# Patient Record
Sex: Male | Born: 1935 | Race: White | Hispanic: No | Marital: Married | Smoking: Never smoker
Health system: Southern US, Community
[De-identification: ages and names within clinical notes are randomized; demographics above are authoritative.]

## PROBLEM LIST (undated history)

## (undated) DIAGNOSIS — E039 Hypothyroidism, unspecified: Secondary | ICD-10-CM

## (undated) DIAGNOSIS — I10 Essential (primary) hypertension: Secondary | ICD-10-CM

## (undated) DIAGNOSIS — E079 Disorder of thyroid, unspecified: Secondary | ICD-10-CM

## (undated) DIAGNOSIS — M1990 Unspecified osteoarthritis, unspecified site: Secondary | ICD-10-CM

## (undated) DIAGNOSIS — E119 Type 2 diabetes mellitus without complications: Secondary | ICD-10-CM

## (undated) HISTORY — PX: EYE SURGERY: SHX253

---

## 2001-03-17 ENCOUNTER — Encounter: Payer: Self-pay | Admitting: Family Medicine

## 2001-03-17 ENCOUNTER — Ambulatory Visit (HOSPITAL_COMMUNITY): Admission: RE | Admit: 2001-03-17 | Discharge: 2001-03-17 | Payer: Self-pay | Admitting: Family Medicine

## 2001-03-20 ENCOUNTER — Encounter: Payer: Self-pay | Admitting: Family Medicine

## 2001-03-20 ENCOUNTER — Ambulatory Visit (HOSPITAL_COMMUNITY): Admission: RE | Admit: 2001-03-20 | Discharge: 2001-03-20 | Payer: Self-pay | Admitting: Family Medicine

## 2001-05-15 ENCOUNTER — Ambulatory Visit (HOSPITAL_COMMUNITY): Admission: RE | Admit: 2001-05-15 | Discharge: 2001-05-15 | Payer: Self-pay | Admitting: Internal Medicine

## 2001-09-15 ENCOUNTER — Ambulatory Visit (HOSPITAL_COMMUNITY): Admission: RE | Admit: 2001-09-15 | Discharge: 2001-09-15 | Payer: Self-pay | Admitting: Internal Medicine

## 2003-03-09 ENCOUNTER — Other Ambulatory Visit: Admission: RE | Admit: 2003-03-09 | Discharge: 2003-03-09 | Payer: Self-pay | Admitting: Dermatology

## 2003-07-28 ENCOUNTER — Other Ambulatory Visit: Admission: RE | Admit: 2003-07-28 | Discharge: 2003-07-28 | Payer: Self-pay | Admitting: Dermatology

## 2003-09-06 ENCOUNTER — Encounter: Payer: Self-pay | Admitting: Family Medicine

## 2003-09-06 ENCOUNTER — Ambulatory Visit (HOSPITAL_COMMUNITY): Admission: RE | Admit: 2003-09-06 | Discharge: 2003-09-06 | Payer: Self-pay | Admitting: Family Medicine

## 2004-03-12 ENCOUNTER — Ambulatory Visit (HOSPITAL_COMMUNITY): Admission: RE | Admit: 2004-03-12 | Discharge: 2004-03-12 | Payer: Self-pay | Admitting: Ophthalmology

## 2004-10-01 ENCOUNTER — Ambulatory Visit (HOSPITAL_COMMUNITY): Admission: RE | Admit: 2004-10-01 | Discharge: 2004-10-01 | Payer: Self-pay | Admitting: Family Medicine

## 2004-10-05 ENCOUNTER — Inpatient Hospital Stay (HOSPITAL_COMMUNITY): Admission: AD | Admit: 2004-10-05 | Discharge: 2004-10-08 | Payer: Self-pay | Admitting: Internal Medicine

## 2005-02-01 ENCOUNTER — Ambulatory Visit: Payer: Self-pay | Admitting: Internal Medicine

## 2005-03-07 ENCOUNTER — Ambulatory Visit: Payer: Self-pay | Admitting: Internal Medicine

## 2005-03-07 ENCOUNTER — Ambulatory Visit (HOSPITAL_COMMUNITY): Admission: RE | Admit: 2005-03-07 | Discharge: 2005-03-07 | Payer: Self-pay | Admitting: Internal Medicine

## 2005-08-15 ENCOUNTER — Ambulatory Visit (HOSPITAL_COMMUNITY): Admission: RE | Admit: 2005-08-15 | Discharge: 2005-08-15 | Payer: Self-pay | Admitting: Internal Medicine

## 2006-05-13 ENCOUNTER — Inpatient Hospital Stay (HOSPITAL_COMMUNITY): Admission: EM | Admit: 2006-05-13 | Discharge: 2006-05-16 | Payer: Self-pay | Admitting: *Deleted

## 2006-05-15 ENCOUNTER — Ambulatory Visit: Payer: Self-pay | Admitting: Gastroenterology

## 2006-06-13 ENCOUNTER — Ambulatory Visit (HOSPITAL_COMMUNITY): Admission: RE | Admit: 2006-06-13 | Discharge: 2006-06-14 | Payer: Self-pay | Admitting: Orthopedic Surgery

## 2006-12-16 ENCOUNTER — Ambulatory Visit (HOSPITAL_COMMUNITY): Admission: RE | Admit: 2006-12-16 | Discharge: 2006-12-16 | Payer: Self-pay | Admitting: Orthopedic Surgery

## 2008-03-29 ENCOUNTER — Ambulatory Visit (HOSPITAL_COMMUNITY): Admission: RE | Admit: 2008-03-29 | Discharge: 2008-03-29 | Payer: Self-pay | Admitting: Internal Medicine

## 2008-04-01 ENCOUNTER — Ambulatory Visit (HOSPITAL_COMMUNITY): Admission: RE | Admit: 2008-04-01 | Discharge: 2008-04-01 | Payer: Self-pay | Admitting: Internal Medicine

## 2008-07-28 ENCOUNTER — Ambulatory Visit (HOSPITAL_COMMUNITY): Admission: RE | Admit: 2008-07-28 | Discharge: 2008-07-28 | Payer: Self-pay | Admitting: Internal Medicine

## 2008-07-28 ENCOUNTER — Encounter (INDEPENDENT_AMBULATORY_CARE_PROVIDER_SITE_OTHER): Payer: Self-pay | Admitting: Interventional Radiology

## 2010-10-25 ENCOUNTER — Emergency Department (HOSPITAL_COMMUNITY): Admission: EM | Admit: 2010-10-25 | Discharge: 2010-02-05 | Payer: Self-pay | Admitting: Emergency Medicine

## 2011-02-10 LAB — DIFFERENTIAL
Basophils Absolute: 0 10*3/uL (ref 0.0–0.1)
Eosinophils Relative: 0 % (ref 0–5)
Lymphocytes Relative: 20 % (ref 12–46)
Lymphs Abs: 1.8 10*3/uL (ref 0.7–4.0)
Monocytes Absolute: 0.5 10*3/uL (ref 0.1–1.0)
Neutro Abs: 6.9 10*3/uL (ref 1.7–7.7)

## 2011-02-10 LAB — CBC
HCT: 43 % (ref 39.0–52.0)
Hemoglobin: 15.3 g/dL (ref 13.0–17.0)
RBC: 4.5 MIL/uL (ref 4.22–5.81)
WBC: 9.2 10*3/uL (ref 4.0–10.5)

## 2011-04-05 NOTE — Op Note (Signed)
NAME:  Jake Becker NO.:  [id number]   MEDICAL RECORD NO.:  [id number]          PATIENT TYPE:  OIB   LOCATION:  5032                         FACILITY:  [facility]   PHYSICIAN:  Burnard Bunting, M.D.    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   PROCEDURE:  Right shoulder manipulation under anesthesia with diagnostic  intraoperative arthroscopy and biceps release, debridement of labrum and  rotator cuff followed by open biceps tenodesis and rotator cuff repair of  the infraspinatus, supraspinatus, and the superior portion of the  subscapularis.   INDICATIONS:  Jake Becker is a 75 year old patient with right shoulder  pain who is 8 weeks out from right shoulder dislocation.  He sustained  rotator cuff tearing during that dislocation and has subsequently developed  a frozen shoulder.  He presents now for operative management of the rotator  cuff tear and frozen shoulder, and of the subluxated biceps tendon.   OPERATIVE FINDINGS:  1.  Examination under anesthesia.  Range of motion and external rotation 15      degrees, abduction 0, isolated glenohumeral abduction 40, isolated      glenohumeral forward flexion 60.  Post manipulation, the patient had 170      of forward flexion, about 45 degrees of external rotation at 15 degrees      of abduction.  Isolated glenohumeral abduction was 110.  The patient had      2+ anterior instability following manipulation with subluxation, but not      frank dislocation.  2.  Diagnostic arthroscopy.      1.  Subluxation of the biceps tendon with type II SLAP tear and labral           degeneration.      2.  Rotator cuff tear involving supraspinatus and infraspinatus with          both soft tissue and bony components of the rotator cuff tear.      3.  Impingement bursitis.      4.  Significant synovitis within the shoulder joint itself.   PROCEDURE IN DETAIL:  The patient was brought to the operating room where  general endotracheal anesthesia was induced and preoperative antibiotics  were administered.  The right shoulder, arm, and hand were prepped with  DuraPrep solution and draped in a sterile manner.  Within the [place]  positioner, the patient's head was in neutral position and the left arm was  well padded.  Following prepping, the patient was draped in a sterile  manner.  The right shoulder topographic anatomy was then marked, including  the posterolateral and anterior margin of the acromion as well as the [facility]  joint and the coracoid process.  Solution of saline and epinephrine was  injected into the subacromial space.  Solution of saline was injected into  the shoulder joint.  A posterior portal was created 2 cm medial and inferior  to the posterolateral margin of the acromion.  The scope was then inserted.  The patient was noted to have a type II to type IV SLAP tear with  significant synovitis within the rotator interval and all aspects of the  rotator cuff and joint itself.  This synovitis was extensively debrided, and  the biceps tendon was released.  Torn labrum superiorly was debrided.  At  this time, following debridement and biceps tendon release facilitated with  placement of an anterior portal, the instruments were removed.  The shoulder  joint was thoroughly irrigated prior to the removal, and the portals were  closed using 3-0 nylon suture.  The shoulder was then reprepped and redraped  with an [place].  An incision was made at the mid portion of the acromion to  repair both the infraspinatus and supraspinatus, as well as to achieve  biceps tenodesis.   The skin incision was made.  Skin and subcutaneous tissue  were sharply divided.  Deltoid was split a measured distance of 4 cm from  the lateral margin of the acromion.  A #1-Vicryl suture was placed at the  bottom of the split to prevent further splitting of the deltoid.  Bursectomy  and subacromial decompression was performed.  The CA ligament was not  released.  The patient was noted to have a rotator cuff tear involving the  supraspinatus and infraspinatus which had both bony and soft tissue  components.  The superior aspect of the transverse humeral ligament was then  incised, and the biceps tendon was identified as it had subluxated medial to  the lesser tuberosity.  The superior portion of the subscap was also torn  and required repair.  At this time, #2-Fiberwire suture was placed through  the biceps tendon, which had already begun the process of autotenodesing.  The tendon was tenodesed using a 7-mm drill into the inferior aspect of the  bicipital groove.  Following biceps tenodesis, the 2 free suture ends were  used to secure the subscapularis back to the lesser tuberosity.  This was  reinforced with a crossing suture across the bicipital groove for optimal  fixation.  In a similar manner, the bony portion of the infraspinatus tear  was fixed with a repair using a corkscrew suture anchor placed through the  bone itself.  This gave a secure repair on the infraspinatus.  The  supraspinatus was then repaired using 2 corkscrew suture anchors and 2  PushLock suture anchors to reproduce the footprint.  The patient had  excellent improved stability, but maintained his range of motion following  the rotator cuff repair.  The incision was thoroughly irrigated.  Deltoid  split was closed using #1-Vicryl suture followed by interrupted inverted 2-0  Vicryl suture and running 3-0 pull-out Prolene.  A bulky dressing and  shoulder immobilizer were applied.  The patient tolerated the  procedure well, without immediate complications.           ______________________________  G. Dorene Grebe, M.D.     GSD/MEDQ  D:  06/13/2006  T:  06/14/2006  Job:  [id number]

## 2011-04-05 NOTE — Op Note (Signed)
NAME:  Jake Becker, Jake Becker               ACCOUNT NO.:  [id number]   MEDICAL RECORD NO.:  [id number]          PATIENT TYPE:  AMB   LOCATION:  SDS                          FACILITY:  [facility]   PHYSICIAN:  Burnard Bunting, M.D.    DATE OF BIRTH:  11-29-1935   DATE OF PROCEDURE:  12/16/2006  DATE OF DISCHARGE:                               OPERATIVE REPORT   PREOPERATIVE DIAGNOSIS:  Right frozen shoulder.   POSTOPERATIVE DIAGNOSIS:  Right frozen shoulder.   PROCEDURE:  Right shoulder manipulation under anesthesia with extensive  arthroscopic debridement of rotator interval.   SURGEON:  Burnard Bunting, M.D.   ASSISTANT:  None.   ANESTHESIA:  General endotracheal.   ESTIMATED BLOOD LOSS:  Minimal.   INDICATIONS:  Jake Becker is a 75 year old patient who fell and  sustained dislocation and rotator cuff tear to his shoulder.  He  subsequently underwent rotator cuff repair and immobilization.  He is  unable to regain significant overhead range of motion, despite attempts  at physical therapy, and presents now for manipulation and debridement.   OPERATIVE FINDINGS:  Examination under anesthesia:  Range of motion,  external rotation at 15 degrees of abduction, about 5 degrees, isolated  glenohumeral abduction at 70, isolated glenohumeral forward flexion is  also about 75.   Arthroscopic findings:  1. Thickened rotator interval without acute inflammatory changes, but      with chronic thickening of the capsule and labrum.  2. Intact rotator cuff repair with the exception of some thinning of      the rotator cuff tissue at the infraspinatus attachment.  3. No bursitis within the subacromial space with adequate      decompression.   PROCEDURE IN DETAIL:  The patient was brought to the operating room  where general endotracheal anesthesia was induced.  The right shoulder  was manipulated into forward flexion, glenohumeral abduction, and  external rotation.  All in all about 20 degrees of  motion was achieved  in each plane.  The patient had fairly rigid glenohumeral adhesions.  Despite this, some gains in range of motion were achieved in forward  flexion, abduction, and external rotation.  At this time the patient was  placed in the head in neutral position in the beach chair position.  He  had the subacromial spaces injected with saline with epinephrine.  The  glenohumeral joint was injected with saline.  The scope was placed into  the glenohumeral joint on the first attempt.  Diagnostic arthroscopy was  performed.  Anterior portal was created.  Extensive debridement of the  rotator interval was performed.  Rotator cuff repair appeared intact,  except for thinning of the rotator cuff tissue at the infraspinatus  attachment.  The intraarticular subscapula was then tacked.  At this  time, extensive debridement of the rotator interval and labrum was  performed.  The nature of the tissue is more in the noninflammatory  phase of frozen shoulder.  At this time the scope was placed into the  subacromial space and no bursitis or subdeltoid adhesions were noted.  No decompression was performed.  At this time, the instruments were  removed from their portals, which were then closed using 3-0 nylon  suture.  The shoulder was then dressed.  Solution of Marcaine, morphine,  and clonidine was put into the glenohumeral joint.  The patient  tolerated the procedure well without immediate complication.      Burnard Bunting, M.D.  Electronically Signed     GSD/MEDQ  D:  12/16/2006  T:  12/16/2006  Job:  [id number]

## 2011-04-05 NOTE — H&P (Signed)
NAME:  Jake Becker, FILL NO.:  [id number]   MEDICAL RECORD NO.:  [id number]           PATIENT TYPE:  INP   LOCATION:  A332                          FACILITY:  [facility]   PHYSICIAN:  Kirk Ruths, M.D.DATE OF BIRTH:  1936/01/24   DATE OF ADMISSION:  DATE OF DISCHARGE:  LH                                HISTORY & PHYSICAL   CHIEF COMPLAINT:  Fever and chills for 2 days.   HISTORY OF PRESENT ILLNESS:  A 75 year old male without significant medical  problems who presents to the office with a 2-day history of fever, chills,  and extreme rigors. The patient has essentially a negative review of  systems, no cough, chest pain, GI symptoms, dysuria, back pain, tick bites,  shortness of breath. In the office, he was having rigors, his temperature  was rising to 101. Urinalysis in the office was significant only for 3+  urobilinogen and was very amber. Of note, the patient approximately a month  ago fell out of a tree and broke several ribs, dislocated is right shoulder  but these problems seem to have subsided and are not symptomatic at this  time.   PAST MEDICAL HISTORY:  He has an anaphylaxis to bee stings otherwise not  allergic to anything. He has a history of hypertension, detached retina, and  diverticulitis. He takes Univasc 7.5 mg daily, Ranitidine 150 b.i.d.   REVIEW OF SYSTEMS:  Included in HPI.   PHYSICAL EXAMINATION:  GENERAL:  A well-developed male who is in extreme  rigors.  VITAL SIGNS:  Temperature is 101, pulse is 88 and regular, respirations 18.  Blood pressure 146/76.  HEENT:  Tympanic membranes are normal, pupils equal to light and  accommodation, oropharynx benign.  NECK:  Supple without JVD, bruit or thyromegaly.  LUNGS:  Clear in all areas.  HEART:  Regular rate and rhythm without murmur, gallop or rub.  ABDOMEN:  Soft, nontender.  EXTREMITIES:  Without cyanosis, clubbing or edema.  NEUROLOGIC:  Grossly intact.   ASSESSMENT:  1.  Fever of  unknown origin.  2.  History of hypertension.      Kirk Ruths, M.D.  Electronically Signed     WMM/MEDQ  D:  05/13/2006  T:  05/13/2006  Job:  [id number]

## 2011-04-05 NOTE — H&P (Signed)
NAME:  Jake Becker, Jake Becker NO.:  [id number]   MEDICAL RECORD NO.:  [id number]           PATIENT TYPE:   LOCATION:                                 FACILITY:   PHYSICIAN:  Madelin Rear. Sherwood Gambler, MD  DATE OF BIRTH:  1936-09-27   DATE OF ADMISSION:  10/05/2004  DATE OF DISCHARGE:  LH                                HISTORY & PHYSICAL   CHIEF COMPLAINT:  Shortness of breath.   HISTORY OF PRESENT ILLNESS:  The patient has been treated with Claforan and  Levaquin for an existing documented left lower lobe pneumonic infiltrate,  documented by Dr. Regino Schultze, on October 01, 2004.  In spite of being  compliant with Levaquin every day, the patient has had progressively  increasing symptoms mostly shortness of breath and possibly some brief  confusional episodes outlined by his wife.  He has also had an exaggerated  startle response when nodding off to sleep.  Denies any headache or stiff  neck or other meningeal signs of symptoms.  No other neurologic symptoms.  He denies any vomiting or diarrhea.  No skin rash.  There were no tick  bites.  A chest x-ray, obtained at the aforementioned office visit today,  did confirm a left lower lobe infiltrate.   PAST MEDICAL HISTORY:  1.  Hypertension, maintained on an ACE inhibitor for that.  2.  Previous diverticulitis and diverticulosis known.  3.  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  4.  He also has had a previous detached retina.  5.  Status post herniorrhaphy.   SOCIAL HISTORY:  Nonsmoker, nondrinker.  No other drug use.  He is retired  at present.   FAMILY HISTORY:  Positive for abdominal aortic aneurysm in his father.  Traumatic death of paternal grandfather and coronary disease in his paternal  grandmother.  Otherwise family history is noncontributory.   REVIEW OF SYSTEMS:  As under HPI, all else negative.   PHYSICAL EXAMINATION:  SKIN:  Unremarkable.  HEAD/NECK:  No JVD or adenopathy.  Neck was supple.  CHEST:  Diminished breath sounds, dullness, and rales in the left lower  lobe.  Rales present also in the base of the right lung.  CARDIAC:  Regular rhythm.  No gallop or rub.  ABDOMEN:  Soft.  No organomegaly or masses.  EXTREMITIES:  Without clubbing, cyanosis, or edema.  NEUROLOGIC:  Gross examination is nonfocal.   IMPRESSION:  1.  The patient has progression of pneumonia with increasing symptoms,      diminished breath sounds on the left side.  He is being admitted for      parenteral antibiotic therapy.  It also appears that he has failed      Levaquin; therefore, he will be admitted for a more broad spectrum      antibiotic coverage.  A chest x-ray will be repeated rule out of an      obstructive lesion possibly contributing to failure of resolution on      adequate outpatient therapy.  Bronchodilators and chest PT as indicated.  2.  Hypertension.  At present he  is pretty well controlled.  Monitor and      intervene as necessary with expectant observation.     Lawr   LJF/MEDQ  D:  10/05/2004  T:  10/05/2004  Job:  [id number]

## 2011-04-05 NOTE — Consult Note (Signed)
NAME:  ELAINE, MIDDLETON               ACCOUNT NO.:  [id number]   MEDICAL RECORD NO.:  [id number]          PATIENT TYPE:  INP   LOCATION:  A332                          FACILITY:  [facility]   PHYSICIAN:  Lionel December, M.D.    DATE OF BIRTH:  [birth year]   DATE OF CONSULTATION:  05/15/2006  DATE OF DISCHARGE:                                   CONSULTATION   REASON FOR CONSULTATION:  Elevated LFTs and fever.   REQUESTING PHYSICIAN:  Dr. Karleen Hampshire   HISTORY OF PRESENT ILLNESS:  Mr. Landgrebe is a 75 year old gentleman who  developed fever and rigors Sunday evening after returning from [place] where  he was visiting his daughter.  He saw Dr. Regino Schultze Tuesday morning after  persistent fevers and was admitted to the hospital.  Work-up thus far has  failed to reveal a cause of his fever.  He denies any ill contacts.  No  known tick bites.  He has had no associated nausea or vomiting, abdominal  pain, constipation, diarrhea, melena, or rectal bleeding.  Had an abdominal  ultrasound which revealed upper limits of normal gallbladder wall thickening  measuring 2.7 mm, echogenic liver, prominent spleen measuring 16.7 cm.  Chest x-ray revealed bibasilar atelectasis or scarring.  His total bilirubin  was 1.4 initially, is down to 1.2, mostly of indirect bilirubin.  His  alkaline phosphatase is normal at 110.  His AST was 56, up to 68, ALT 45, up  to 54, and albumin 3.4.  Lyme and [facility] Spotted Fever titers are  negative.  His blood culture and urine culture are pending, but remain  negative.  Sedimentation rate was 2.  White count down to 2200 today with an  ANC of 1.1.  Platelet count down from 117,000 to 90,000.  Hemoglobin is  normal at 14.8.  Overall, he feels fine except for the fever and rigors when  they occur.   HOME MEDICATIONS:  1.  Univasc 15 mg daily.  2.  Metamucil two tablets daily.  3.  Calcium 500 mg daily.   ALLERGIES:  No known drug allergies.   PAST MEDICAL HISTORY:  1.  Hypertension.  2.  Right inguinal hernia repair in the remote past.  3.  History of pan colonic diverticulosis mostly in the sigmoid colon, small      external hemorrhoids on colonoscopy in April 2006 by Dr. Karilyn Cota.  4.  History of left lower lobe pneumonia in November 2005.  5.  History of branch retinal vein occlusion of the right eye and vitreous      hemorrhage of the right eye status post surgery.  He had an      echocardiogram during this hospitalization which revealed mild aortic      stenosis.   FAMILY HISTORY:  Negative for chronic GI illnesses, colorectal cancer.   SOCIAL HISTORY:  He is married with one daughter.  He is retired.  Never  smoked cigarettes.  Occasionally consumes wine.  Denies any blood  transfusions or tattoos.   REVIEW OF SYSTEMS:  See HPI for GI.  CONSTITUTIONAL:  No weight loss.  GU:  Complains of urinary frequency and some hesitancy.   PHYSICAL EXAMINATION:  VITAL SIGNS:  Tmax 102.6, Tcurrent 98.7, pulse 83,  respirations 16, blood pressure 133/75.  GENERAL:  Pleasant, well-nourished, well-developed   Dictation ended at this point.      Tana Coast, P.A.      Lionel December, M.D.  Electronically Signed    LL/MEDQ  D:  05/15/2006  T:  05/15/2006  Job:  [id number]

## 2011-04-05 NOTE — Discharge Summary (Signed)
NAME:  Jake Becker, Jake Becker               ACCOUNT NO.:  [id number]   MEDICAL RECORD NO.:  [id number]          PATIENT TYPE:  INP   LOCATION:  A332                          FACILITY:  [facility]   PHYSICIAN:  Madelin Rear. Sherwood Gambler, MD  DATE OF BIRTH:  1936/07/12   DATE OF ADMISSION:  10/05/2004  DATE OF DISCHARGE:  11/21/2005LH                                 DISCHARGE SUMMARY   DISCHARGE DIAGNOSES:  1.  Left lower lobe pneumonia, probable atypical.  2.  Chronic sinusitis.  3.  Left upper extremity paresthesias, question etiology.   DISCHARGE MEDICATIONS:  1.  Vibramycin 100 mg p.o. b.i.d. x14 days.  2.  Levaquin 500 mg p.o. q.d. x14 days.   HOSPITAL COURSE:  The patient was admitted with failure to improve pneumonic  infection on oral antibiotics for about 7 days.  He also developed new-onset  of headaches and some left upper extremity paresthesias that were not  predictable and not associated with any motor symptomatology or cranial  nerve symptomatology.  He was admitted for high-dose parenteral antibiotics.  Chest x-ray was repeated and showed no change of a stable left lower lobe  infiltrate was retrocardiac and barely perceptible on the film.  He was  incidentally noted on rounds to have severe obstructive sleep apnea  clinically.  This was an incidental finding.   FOLLOW UP:  He will follow up with Dr. Gerilyn Pilgrim for evaluation of the upper  extremity paresthesias as well as sleep study for rule out sleep apnea.  He  will follow up in the office in 1 week.  Followup chest x-ray in 1 month.  The patient informed regarding the need for these followups.     Lawr   LJF/MEDQ  D:  10/08/2004  T:  10/08/2004  Job:  [id number]

## 2011-04-05 NOTE — Discharge Summary (Signed)
NAME:  [name], Jake NO.:  [id number]   MEDICAL RECORD NO.:  [id number]          PATIENT TYPE:  INP   LOCATION:  A332                          FACILITY:  [facility]   PHYSICIAN:  Kirk Ruths, M.D.DATE OF BIRTH:  1936-10-28   DATE OF ADMISSION:  05/13/2006  DATE OF DISCHARGE:  06/29/2007LH                                 DISCHARGE SUMMARY   DISCHARGE DIAGNOSES:  1.  Febrile illness.  2.  Leukopenia.  3.  Increased liver function tests.  4.  History of hypertension.  5.  Antibodies positive to hepatitis A.   HOSPITAL COURSE:  This is a 75 year old male who presented to my office with  a 2-day history of fever, chills, and extreme rigors.  In the office he was  found to have a temperature of 101.  Urinalysis had 3+ urobilinogen.  The  patient was admitted to the hospital.  He was placed empirically on  doxicycline and Rocephin.  The patient underwent extensive workup.  He was  found initially to have a white count of 3500, hemoglobin of 15.9.  His  white count drifted down to 2200 and on day of admission was 3800.  The  patient's sed rate was 2, which was perplexing.  Electrolytes were normal.  His liver function tests were elevated.  AST was 56, ALT 45, total bili  initially was 1.4 with indirect being 1.1.  At the time of discharge, AST  was 72, ALT 62, and total bili was down to 1.0.  The patient's heavy metal  screens were negative for arsenic and mercury.  Lead was slightly elevated  at 8.2, with 8.0 being the cutoff.  Urinalysis repeat showed only  urobilinogen, no white cells.  Blood cultures x2 were negative.  Urine  culture was negative.  [facility] spotted fever and Lyme's test were  negative.  Finally, the patient's hepatitis screen came back and he was  positive for hepatitis A antibodies, negative for B and C.  By this time the  patient's fever had defervesced.  He was discharged home on doxycycline and  his previous medications, to be followed  up in my office in 1 week.      Kirk Ruths, M.D.  Electronically Signed     WMM/MEDQ  D:  06/10/2006  T:  06/10/2006  Job:  [id number]

## 2011-04-05 NOTE — Op Note (Signed)
[facility]  Patient:    Jake Becker, Jake Becker Visit Number: [id number] MRN: [id number]          Service Type: END Location: DAY Attending Physician:  Malissa Hippo Dictated by:   Lionel December, M.D. Proc. Date: 09/15/01 Admit Date:  09/15/2001   CC:         Karleen Hampshire, M.D.   Operative Report  PROCEDURE:  Total colonoscopy.  ENDOSCOPIST:  Lionel December, M.D.  INDICATION:  Jake Becker is a 75 year old Caucasian male who is undergoing colonoscopy primarily for diagnostic reasons.  He had rectal bleeding in the past which he does not remember now.  He denies recent change in his bowel habits.  Family history is negative for colorectal carcinoma.  Procedure and risks were reviewed with the patient and informed consent was obtained.  PREOPERATIVE MEDICATIONS:  Demerol 50 mg IV, Versed 4 mg IV in divided dose.  INSTRUMENT:  Olympus video system.  FINDINGS:  Procedure was performed in endoscopy suite.  Patients vital signs and O2 saturations were monitored during the procedure and remained stable. Patient was placed in the left lateral decubitus position and rectal examination performed.  This was within normal limits.  Scope was placed in the rectum and advanced under direct vision to the sigmoid colon and beyond. He had a few scattered pieces of formed stool in the sigmoid colon with a moderate number of diverticula.  Preparation otherwise was satisfactory. Scope was passed into the cecum, which was identified by the ileocecal valve and appendiceal orifice.  Pictures were taken for the record.  As the scope was withdrawn, mucosa was once again carefully examined and there were no polyps and/or tumor masses.  A few diverticula were also noted in the descending colon.  Rectal mucosa was normal.  Scope was retroflexed and examined the anorectal junction, which was unremarkable.  Endoscope was straightened and withdrawn.  Patient tolerated the  procedure well.  FINAL DIAGNOSIS:  Left colonic diverticulosis, otherwise normal examination to the cecum.  RECOMMENDATIONS: 1. High-fiber diet. 2. Metamucil one tablespoonful daily.   3. He should continue yearly Hemoccults and consider having next exam in    10 years from now. Dictated by:   Lionel December, M.D. Attending Physician:  Malissa Hippo DD:  09/15/01 TD:  09/16/01 Job: [id number]

## 2011-04-05 NOTE — Op Note (Signed)
NAME:  Jake Becker, Jake Becker NO.:  [id number]   MEDICAL RECORD NO.:  [id number]                   PATIENT TYPE:  OIB   LOCATION:  2899                                 FACILITY:  [facility]   PHYSICIAN:  Alford Highland. Rankin, M.D.                DATE OF BIRTH:  12-Feb-1936   DATE OF PROCEDURE:  03/12/2004  DATE OF DISCHARGE:  03/12/2004                                 OPERATIVE REPORT   PREOPERATIVE DIAGNOSES:  1. Dense vitreous hemorrhage, right eye, nonclearing.  2. History of branch retinal vein occlusion, right eye.   POSTOPERATIVE DIAGNOSIS:  1. Dense vitreous hemorrhage, right eye, nonclearing.  2. History of branch retinal vein occlusion, right eye.   PROCEDURE:  1. Posterior vitrectomy and endolaser pan photocoagulation.  2. Endodiathermy of retinopathy, right eye.   SURGEON:  Alford Highland. Rankin, M.D.   ANESTHESIA:  Local retrobulbar with monitored anesthesia control.   INDICATIONS:  The patient is a 75 year old man with has had profound vision  loss in the right eye on the basis on dense, vitreous, nonclearing  hemorrhage.  This is an attempt to clear the vitreous medial opacities so as  to allow for best visual acuity but also to induce quiescence of  retinopathy.  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  The patient  understands the risks of anesthesia, including the rare occurrence of death,  damage to the eye including but not limited to hemorrhage, infection, scar,  need for another surgery, no change in vision, loss of vision and  progression of disease despite intervention.  Appropriate signed consent  obtained.  The patient was taken to the operating room.   DESCRIPTION OF PROCEDURE:  In the operating room, appropriate monitors were  applied and after mild sedation, 0.5% Marcaine delivered 5 mL in the  retrobulbar fashion of modified Darel Hong.  The right periorbital  area was  prepped and draped in the usual ophthalmic fashion.  Lid speculum applied.  Conjunctival peritomy was then fashioned temporally and supranasally.  A 4  mm infusion was secured 4 mm posterior to the limbus in the infratemporal  quadrant.  Placement in the vitreous cavity verified visually.  Superiorly  sclerotomy was fashion.  The [employer] was placed into position with  the BIOM attached.  Core vitrectomy was then begun.  Dense old vitreous  hemorrhage was identified and trimmed 360 degrees.  Its attachment to the  optic nerve was identified and trimmed back.  Endodiathermy was placed to  the retinopathy and neovascular tissue was visualized as well as  supratemporal arcade.  Endolaser photocoagulation applied infratemporally  and temporally.   Hemostasis was otherwise spontaneous.  Vitreous skirt trimmed 360 degrees  using scleral depression.  Vitreous removed from the eye and superior  sclerotomy was closed with 7-0 Vicryl.  The infusion was oversewn and  closed.  Conjunctiva closed with 7-0 Vicryl.  Subconjunctival injection of  antibiotics and steroids were applied.  A sterile patch and Fox shield were  applied.  The patient tolerated the procedure well without complications.  He was taken to the recovery room in good and stable condition.                                               Alford Highland Rankin, M.D.    GAR/MEDQ  D:  03/12/2004  T:  03/13/2004  Job:  [id number]

## 2011-04-05 NOTE — Procedures (Signed)
NAME:  [name], [name] NO.:  [id number]   MEDICAL RECORD NO.:  [id number]          PATIENT TYPE:  INP   LOCATION:  A332                          FACILITY:  [facility]   PHYSICIAN:  Darlin Priestly, MD  DATE OF BIRTH:  03-Feb-1936   DATE OF PROCEDURE:  05/14/2006  DATE OF DISCHARGE:                                  ECHOCARDIOGRAM   Jake Becker is a 75 year old male patient of Dr. Regino Schultze with history of  hypertension and fever.  He is now referred for 2D echocardiogram, evaluate  LV function and valvular structures.   The aorta is within normal limits at 3 cm.   The left atrium is within normal limits at 3.9 cm.  No clots seen.  The  patient was in sinus rhythm during the procedure.   IVS __________ considerably thickened at 1.6 and 1.5 cm, respectively.   The aortic valve is mild to moderately thickened with probable mild aortic  stenosis with mean gradient of 11 mmHg and no significant regurgitation.   Mitral valve leaflets are mildly thickened with trivial mitral  regurgitation.   Structurally normal tricuspid valve with trivial tricuspid regurgitation.   Left ventricular interventions within normal limits at 4.5 and 3.2 cm  respectively.  There is good overall left ventricular function estimated at  approximately 60% with no segmental wall motion abnormalities visualized.  Normal RV size and systolic function.   CONCLUSION:  1.  Concentric LVH with normal LV systolic function estimated EF of 60%.  2.  Moderately thickened aortic valve with probable mild aortic stenosis and      no significant regurgitation.  3.  Mildly thickened mitral valve leaflets with trivial mitral      regurgitation.  4.  Structurally normal tricuspid valve with mild tricuspid regurgitation.  5.  Normal RV size and systolic function.      Darlin Priestly, MD  Electronically Signed     RHM/MEDQ  D:  05/14/2006  T:  05/14/2006  Job:  [id number]

## 2011-04-05 NOTE — Consult Note (Signed)
NAME:  Jake Becker, Jake Becker               ACCOUNT NO.:  [id number]   MEDICAL RECORD NO.:  [id number]          PATIENT TYPE:  INP   LOCATION:  A332                          FACILITY:  [facility]   PHYSICIAN:  Lionel December, M.D.    DATE OF BIRTH:  December 17, 1935   DATE OF CONSULTATION:  05/15/2006  DATE OF DISCHARGE:                                   CONSULTATION   CONTINUATION:  job #[id number]   PHYSICAL EXAMINATION:  VITAL SIGNS:  Tmax 102.6, Tcurrent 98.7, pulse 83,  respirations 16, blood pressure 133/75.  GENERAL:  Pleasant, well-nourished, well-developed Caucasian gentleman in no  acute distress.  SKIN:  Warm and dry, no jaundice.  HEENT:  Sclerae non-icteric.  Oropharyngeal mucosa moist and pink.  No  lesions, erythema, or exudate.  No lymphadenopathy, thyromegaly.  CHEST:  Lungs are clear to auscultation.  CARDIAC:  Regular rate and rhythm.  Normal S1, S2.  No murmurs, rubs, or  gallops.  ABDOMEN:  Positive bowel sounds, soft, nontender, nondistended.  No  hepatosplenomegaly or masses.  EXTREMITIES:  No edema.   LABORATORIES:  White count 3500 with ANC of 3 on admission, dropped down to  2200 with an ANC of 1.1.  Hemoglobin 14.8, platelets 90,000.  Sedimentation  rate 2.  BUN 11, creatinine 0.9, glucose 125, total bilirubin 1.4-1.2,  direct bilirubin 0.3-0.3, alkaline phosphatase 104-110, AST 56-68, ALT 45-  54, and albumin 3.4.  Blood cultures and urine cultures remain negative.  [facility] Spotted Fever titers were negative.  Abdominal  ultrasound revealed upper limit of normal gallbladder wall thickness at 2.7  mm with an echogenic liver and a prominent spleen measuring 16.7 cm.  Chest  x-ray revealed bibasilar atelectasis or scarring.   IMPRESSION:  Patient is a 75 year old gentleman with fever of unknown origin  and elevated LFTs.  ANC is down significantly with thrombocytopenia as well.  Suspect viral etiology.  Doubt acute viral hepatitis.  LFTs may be a chronic  finding  in the setting of fatty liver (baseline not known at this time).   RECOMMENDATIONS:  1.  Follow up pending laboratories.  2.  Continue supportive measures.  No further work-up at this time as      patient appears to have made improvement and has been afebrile for this      morning.  If he develops recurrent fever this evening may consider EBV      titers or CMV titers.      Tana Coast, P.A.      Lionel December, M.D.  Electronically Signed    LL/MEDQ  D:  05/16/2006  T:  05/16/2006  Job:  [id number]

## 2011-04-05 NOTE — Op Note (Signed)
NAME:  Jake Becker, Jake Becker               ACCOUNT NO.:  [id number]   MEDICAL RECORD NO.:  [id number]          PATIENT TYPE:  AMB   LOCATION:  DAY                           FACILITY:  [facility]   PHYSICIAN:  Lionel December, M.D.    DATE OF BIRTH:  01-20-1936   DATE OF PROCEDURE:  03/07/2005  DATE OF DISCHARGE:                                 OPERATIVE REPORT   PROCEDURE:  Colonoscopy.   INDICATION:  Jake Becker is a 75 year old Caucasian male who recently presented to  Dr. Regino Schultze with a single episode of rectal bleeding which occurred  spontaneously while he was taking a shower, but he had been dizzy out in the  field that day.  He recalls there was a large amount of blood, although he  did not have any postural symptoms.  He was seen by Dr. Regino Schultze, who felt he  was bleeding from hemorrhoids.  Given the amount of bleeding, he felt that  his colon needed to be examined.  The patient's last colonoscopy was 3-1/2  years ago.  He is undergoing colonoscopy primarily for diagnostic purposes.  The procedure risks were reviewed with the patient, informed consent was  obtained.   PREMEDICATION:  Demerol 25 mg IV, Versed 4 mg IV in divided dose.   FINDINGS:  Procedure performed in endoscopy suite.  The patient's vital  signs and O2 saturation were monitored during procedure and remained stable.  The patient was placed in the left lateral recumbent position and rectal  examination performed.  No abnormality noted on external or digital exam.  The Olympus video scope was placed in the rectum and advanced under vision  into sigmoid colon and beyond.  Multiple diverticula were noted at sigmoid  colon.  Some of them had stool in them.  Otherwise, prep was satisfactory.  The scope was passed to the cecum, which was identified by appendiceal  orifice and the ileocecal valve.  A few small diverticula were noted at the  ascending and transverse colon, but most of these were at the descending and  sigmoid colon.   There were no polyps and/or tumor masses.  Rectal mucosa was  normal.  The scope was retroflexed and small hemorrhoids were noted below  the dentate line.  The endoscope was straightened and withdrawn.  The  patient tolerated the procedure well.   FINAL DIAGNOSES:  1.  Pancolonic diverticulosis.  Most of the diverticula, however, at sigmoid      colon.  2.  Small external hemorrhoids.   Given his history, I would suspect there was a diverticular bleed; however,  he could have bled from diverticula as well as hemorrhoids at the same time.   RECOMMENDATIONS:  1.  No further workup.  2.  He should stay on a high-fiber diet and take Citrucel or equivalent one      tablespoonful daily.  3.  He should continue yearly Hemoccults and consider next exam in 10 years      from now unless there were other issues.  Consider next exam in 10 years      from now.  NR/MEDQ  D:  03/07/2005  T:  03/07/2005  Job:  [id number]   cc:   Kirk Ruths, M.D.  [address]  [place]  [place] [address]  Fax: [phone]

## 2011-08-21 LAB — CBC
HCT: 42.7
Hemoglobin: 14.6
MCV: 98.4
Platelets: 196
RDW: 12.7

## 2011-12-23 DIAGNOSIS — E119 Type 2 diabetes mellitus without complications: Secondary | ICD-10-CM | POA: Diagnosis not present

## 2011-12-23 DIAGNOSIS — E785 Hyperlipidemia, unspecified: Secondary | ICD-10-CM | POA: Diagnosis not present

## 2011-12-23 DIAGNOSIS — Z6831 Body mass index (BMI) 31.0-31.9, adult: Secondary | ICD-10-CM | POA: Diagnosis not present

## 2011-12-23 DIAGNOSIS — I10 Essential (primary) hypertension: Secondary | ICD-10-CM | POA: Diagnosis not present

## 2011-12-23 DIAGNOSIS — E039 Hypothyroidism, unspecified: Secondary | ICD-10-CM | POA: Diagnosis not present

## 2012-02-25 DIAGNOSIS — J309 Allergic rhinitis, unspecified: Secondary | ICD-10-CM | POA: Diagnosis not present

## 2012-04-16 DIAGNOSIS — H2510 Age-related nuclear cataract, unspecified eye: Secondary | ICD-10-CM | POA: Diagnosis not present

## 2012-04-16 DIAGNOSIS — H348392 Tributary (branch) retinal vein occlusion, unspecified eye, stable: Secondary | ICD-10-CM | POA: Diagnosis not present

## 2012-04-17 DIAGNOSIS — Z125 Encounter for screening for malignant neoplasm of prostate: Secondary | ICD-10-CM | POA: Diagnosis not present

## 2012-04-17 DIAGNOSIS — E119 Type 2 diabetes mellitus without complications: Secondary | ICD-10-CM | POA: Diagnosis not present

## 2012-04-17 DIAGNOSIS — I10 Essential (primary) hypertension: Secondary | ICD-10-CM | POA: Diagnosis not present

## 2012-04-17 DIAGNOSIS — E785 Hyperlipidemia, unspecified: Secondary | ICD-10-CM | POA: Diagnosis not present

## 2012-04-30 DIAGNOSIS — H259 Unspecified age-related cataract: Secondary | ICD-10-CM | POA: Diagnosis not present

## 2012-04-30 DIAGNOSIS — H4720 Unspecified optic atrophy: Secondary | ICD-10-CM | POA: Diagnosis not present

## 2012-04-30 DIAGNOSIS — H31009 Unspecified chorioretinal scars, unspecified eye: Secondary | ICD-10-CM | POA: Diagnosis not present

## 2012-04-30 DIAGNOSIS — E119 Type 2 diabetes mellitus without complications: Secondary | ICD-10-CM | POA: Diagnosis not present

## 2012-05-13 DIAGNOSIS — J309 Allergic rhinitis, unspecified: Secondary | ICD-10-CM | POA: Diagnosis not present

## 2012-06-03 DIAGNOSIS — J3089 Other allergic rhinitis: Secondary | ICD-10-CM | POA: Diagnosis not present

## 2012-06-16 DIAGNOSIS — T6391XA Toxic effect of contact with unspecified venomous animal, accidental (unintentional), initial encounter: Secondary | ICD-10-CM | POA: Diagnosis not present

## 2012-06-29 DIAGNOSIS — H348392 Tributary (branch) retinal vein occlusion, unspecified eye, stable: Secondary | ICD-10-CM | POA: Diagnosis not present

## 2012-06-29 DIAGNOSIS — H2510 Age-related nuclear cataract, unspecified eye: Secondary | ICD-10-CM | POA: Diagnosis not present

## 2012-06-29 DIAGNOSIS — H43399 Other vitreous opacities, unspecified eye: Secondary | ICD-10-CM | POA: Diagnosis not present

## 2012-06-29 DIAGNOSIS — H31009 Unspecified chorioretinal scars, unspecified eye: Secondary | ICD-10-CM | POA: Diagnosis not present

## 2012-07-21 DIAGNOSIS — I10 Essential (primary) hypertension: Secondary | ICD-10-CM | POA: Diagnosis not present

## 2012-07-21 DIAGNOSIS — Z7182 Exercise counseling: Secondary | ICD-10-CM | POA: Diagnosis not present

## 2012-07-21 DIAGNOSIS — E119 Type 2 diabetes mellitus without complications: Secondary | ICD-10-CM | POA: Diagnosis not present

## 2012-07-21 DIAGNOSIS — E785 Hyperlipidemia, unspecified: Secondary | ICD-10-CM | POA: Diagnosis not present

## 2012-11-02 DIAGNOSIS — I10 Essential (primary) hypertension: Secondary | ICD-10-CM | POA: Diagnosis not present

## 2012-11-02 DIAGNOSIS — Z6825 Body mass index (BMI) 25.0-25.9, adult: Secondary | ICD-10-CM | POA: Diagnosis not present

## 2012-11-02 DIAGNOSIS — E039 Hypothyroidism, unspecified: Secondary | ICD-10-CM | POA: Diagnosis not present

## 2012-11-02 DIAGNOSIS — E785 Hyperlipidemia, unspecified: Secondary | ICD-10-CM | POA: Diagnosis not present

## 2012-11-02 DIAGNOSIS — E119 Type 2 diabetes mellitus without complications: Secondary | ICD-10-CM | POA: Diagnosis not present

## 2012-12-08 DIAGNOSIS — E119 Type 2 diabetes mellitus without complications: Secondary | ICD-10-CM | POA: Diagnosis not present

## 2012-12-08 DIAGNOSIS — H2510 Age-related nuclear cataract, unspecified eye: Secondary | ICD-10-CM | POA: Diagnosis not present

## 2012-12-08 DIAGNOSIS — H348392 Tributary (branch) retinal vein occlusion, unspecified eye, stable: Secondary | ICD-10-CM | POA: Diagnosis not present

## 2013-02-16 DIAGNOSIS — Z6830 Body mass index (BMI) 30.0-30.9, adult: Secondary | ICD-10-CM | POA: Diagnosis not present

## 2013-02-16 DIAGNOSIS — I10 Essential (primary) hypertension: Secondary | ICD-10-CM | POA: Diagnosis not present

## 2013-02-16 DIAGNOSIS — E785 Hyperlipidemia, unspecified: Secondary | ICD-10-CM | POA: Diagnosis not present

## 2013-02-16 DIAGNOSIS — E119 Type 2 diabetes mellitus without complications: Secondary | ICD-10-CM | POA: Diagnosis not present

## 2013-04-15 DIAGNOSIS — M7130 Other bursal cyst, unspecified site: Secondary | ICD-10-CM | POA: Diagnosis not present

## 2013-04-15 DIAGNOSIS — D235 Other benign neoplasm of skin of trunk: Secondary | ICD-10-CM | POA: Diagnosis not present

## 2013-04-15 DIAGNOSIS — L570 Actinic keratosis: Secondary | ICD-10-CM | POA: Diagnosis not present

## 2013-04-29 DIAGNOSIS — D485 Neoplasm of uncertain behavior of skin: Secondary | ICD-10-CM | POA: Diagnosis not present

## 2013-04-29 DIAGNOSIS — L570 Actinic keratosis: Secondary | ICD-10-CM | POA: Diagnosis not present

## 2013-04-29 DIAGNOSIS — M6749 Ganglion, multiple sites: Secondary | ICD-10-CM | POA: Diagnosis not present

## 2013-05-06 DIAGNOSIS — H5310 Unspecified subjective visual disturbances: Secondary | ICD-10-CM | POA: Diagnosis not present

## 2013-05-06 DIAGNOSIS — H43819 Vitreous degeneration, unspecified eye: Secondary | ICD-10-CM | POA: Diagnosis not present

## 2013-05-06 DIAGNOSIS — H259 Unspecified age-related cataract: Secondary | ICD-10-CM | POA: Diagnosis not present

## 2013-05-06 DIAGNOSIS — E119 Type 2 diabetes mellitus without complications: Secondary | ICD-10-CM | POA: Diagnosis not present

## 2013-05-31 DIAGNOSIS — Z6829 Body mass index (BMI) 29.0-29.9, adult: Secondary | ICD-10-CM | POA: Diagnosis not present

## 2013-05-31 DIAGNOSIS — E785 Hyperlipidemia, unspecified: Secondary | ICD-10-CM | POA: Diagnosis not present

## 2013-05-31 DIAGNOSIS — E119 Type 2 diabetes mellitus without complications: Secondary | ICD-10-CM | POA: Diagnosis not present

## 2013-05-31 DIAGNOSIS — Z125 Encounter for screening for malignant neoplasm of prostate: Secondary | ICD-10-CM | POA: Diagnosis not present

## 2013-05-31 DIAGNOSIS — I10 Essential (primary) hypertension: Secondary | ICD-10-CM | POA: Diagnosis not present

## 2013-05-31 DIAGNOSIS — E039 Hypothyroidism, unspecified: Secondary | ICD-10-CM | POA: Diagnosis not present

## 2013-06-01 DIAGNOSIS — H269 Unspecified cataract: Secondary | ICD-10-CM | POA: Diagnosis not present

## 2013-06-01 DIAGNOSIS — H25019 Cortical age-related cataract, unspecified eye: Secondary | ICD-10-CM | POA: Diagnosis not present

## 2013-06-01 DIAGNOSIS — H2510 Age-related nuclear cataract, unspecified eye: Secondary | ICD-10-CM | POA: Diagnosis not present

## 2013-06-15 DIAGNOSIS — T6391XA Toxic effect of contact with unspecified venomous animal, accidental (unintentional), initial encounter: Secondary | ICD-10-CM | POA: Diagnosis not present

## 2013-07-06 DIAGNOSIS — H348392 Tributary (branch) retinal vein occlusion, unspecified eye, stable: Secondary | ICD-10-CM | POA: Diagnosis not present

## 2013-07-06 DIAGNOSIS — E119 Type 2 diabetes mellitus without complications: Secondary | ICD-10-CM | POA: Diagnosis not present

## 2013-07-06 DIAGNOSIS — H2510 Age-related nuclear cataract, unspecified eye: Secondary | ICD-10-CM | POA: Diagnosis not present

## 2013-10-25 DIAGNOSIS — E119 Type 2 diabetes mellitus without complications: Secondary | ICD-10-CM | POA: Diagnosis not present

## 2013-10-25 DIAGNOSIS — E039 Hypothyroidism, unspecified: Secondary | ICD-10-CM | POA: Diagnosis not present

## 2013-10-25 DIAGNOSIS — Z23 Encounter for immunization: Secondary | ICD-10-CM | POA: Diagnosis not present

## 2013-10-25 DIAGNOSIS — Z6830 Body mass index (BMI) 30.0-30.9, adult: Secondary | ICD-10-CM | POA: Diagnosis not present

## 2013-10-25 DIAGNOSIS — E785 Hyperlipidemia, unspecified: Secondary | ICD-10-CM | POA: Diagnosis not present

## 2013-10-25 DIAGNOSIS — I10 Essential (primary) hypertension: Secondary | ICD-10-CM | POA: Diagnosis not present

## 2014-02-28 DIAGNOSIS — E039 Hypothyroidism, unspecified: Secondary | ICD-10-CM | POA: Diagnosis not present

## 2014-02-28 DIAGNOSIS — I10 Essential (primary) hypertension: Secondary | ICD-10-CM | POA: Diagnosis not present

## 2014-02-28 DIAGNOSIS — E119 Type 2 diabetes mellitus without complications: Secondary | ICD-10-CM | POA: Diagnosis not present

## 2014-02-28 DIAGNOSIS — E785 Hyperlipidemia, unspecified: Secondary | ICD-10-CM | POA: Diagnosis not present

## 2014-02-28 DIAGNOSIS — B070 Plantar wart: Secondary | ICD-10-CM | POA: Diagnosis not present

## 2014-02-28 DIAGNOSIS — Z6830 Body mass index (BMI) 30.0-30.9, adult: Secondary | ICD-10-CM | POA: Diagnosis not present

## 2014-04-12 DIAGNOSIS — H3520 Other non-diabetic proliferative retinopathy, unspecified eye: Secondary | ICD-10-CM | POA: Diagnosis not present

## 2014-04-12 DIAGNOSIS — H348392 Tributary (branch) retinal vein occlusion, unspecified eye, stable: Secondary | ICD-10-CM | POA: Diagnosis not present

## 2014-04-12 DIAGNOSIS — E119 Type 2 diabetes mellitus without complications: Secondary | ICD-10-CM | POA: Diagnosis not present

## 2014-06-08 DIAGNOSIS — E039 Hypothyroidism, unspecified: Secondary | ICD-10-CM | POA: Diagnosis not present

## 2014-06-08 DIAGNOSIS — E119 Type 2 diabetes mellitus without complications: Secondary | ICD-10-CM | POA: Diagnosis not present

## 2014-06-08 DIAGNOSIS — Z6830 Body mass index (BMI) 30.0-30.9, adult: Secondary | ICD-10-CM | POA: Diagnosis not present

## 2014-06-08 DIAGNOSIS — Z125 Encounter for screening for malignant neoplasm of prostate: Secondary | ICD-10-CM | POA: Diagnosis not present

## 2014-06-08 DIAGNOSIS — I10 Essential (primary) hypertension: Secondary | ICD-10-CM | POA: Diagnosis not present

## 2014-06-08 DIAGNOSIS — E785 Hyperlipidemia, unspecified: Secondary | ICD-10-CM | POA: Diagnosis not present

## 2014-07-22 DIAGNOSIS — H31009 Unspecified chorioretinal scars, unspecified eye: Secondary | ICD-10-CM | POA: Diagnosis not present

## 2014-07-22 DIAGNOSIS — H2640 Unspecified secondary cataract: Secondary | ICD-10-CM | POA: Diagnosis not present

## 2014-07-22 DIAGNOSIS — H5310 Unspecified subjective visual disturbances: Secondary | ICD-10-CM | POA: Diagnosis not present

## 2014-07-22 DIAGNOSIS — E119 Type 2 diabetes mellitus without complications: Secondary | ICD-10-CM | POA: Diagnosis not present

## 2014-08-18 DIAGNOSIS — H26492 Other secondary cataract, left eye: Secondary | ICD-10-CM | POA: Diagnosis not present

## 2014-08-18 DIAGNOSIS — H2640 Unspecified secondary cataract: Secondary | ICD-10-CM | POA: Diagnosis not present

## 2014-10-21 DIAGNOSIS — I10 Essential (primary) hypertension: Secondary | ICD-10-CM | POA: Diagnosis not present

## 2014-10-21 DIAGNOSIS — E119 Type 2 diabetes mellitus without complications: Secondary | ICD-10-CM | POA: Diagnosis not present

## 2014-10-21 DIAGNOSIS — E782 Mixed hyperlipidemia: Secondary | ICD-10-CM | POA: Diagnosis not present

## 2014-10-21 DIAGNOSIS — E063 Autoimmune thyroiditis: Secondary | ICD-10-CM | POA: Diagnosis not present

## 2014-10-24 DIAGNOSIS — E119 Type 2 diabetes mellitus without complications: Secondary | ICD-10-CM | POA: Diagnosis not present

## 2014-12-20 DIAGNOSIS — M25562 Pain in left knee: Secondary | ICD-10-CM | POA: Diagnosis not present

## 2014-12-20 DIAGNOSIS — M170 Bilateral primary osteoarthritis of knee: Secondary | ICD-10-CM | POA: Diagnosis not present

## 2014-12-20 DIAGNOSIS — M25561 Pain in right knee: Secondary | ICD-10-CM | POA: Diagnosis not present

## 2014-12-28 ENCOUNTER — Encounter (HOSPITAL_COMMUNITY): Payer: Self-pay

## 2014-12-28 ENCOUNTER — Emergency Department (HOSPITAL_COMMUNITY): Payer: Medicare Other

## 2014-12-28 ENCOUNTER — Emergency Department (HOSPITAL_COMMUNITY)
Admission: EM | Admit: 2014-12-28 | Discharge: 2014-12-28 | Disposition: A | Discharge status: Home / Self Care | Payer: Medicare Other | Attending: Emergency Medicine | Admitting: Emergency Medicine

## 2014-12-28 DIAGNOSIS — Y9289 Other specified places as the place of occurrence of the external cause: Secondary | ICD-10-CM | POA: Diagnosis not present

## 2014-12-28 DIAGNOSIS — S50812A Abrasion of left forearm, initial encounter: Secondary | ICD-10-CM | POA: Insufficient documentation

## 2014-12-28 DIAGNOSIS — Z79899 Other long term (current) drug therapy: Secondary | ICD-10-CM | POA: Insufficient documentation

## 2014-12-28 DIAGNOSIS — E039 Hypothyroidism, unspecified: Secondary | ICD-10-CM | POA: Diagnosis not present

## 2014-12-28 DIAGNOSIS — W01198A Fall on same level from slipping, tripping and stumbling with subsequent striking against other object, initial encounter: Secondary | ICD-10-CM | POA: Insufficient documentation

## 2014-12-28 DIAGNOSIS — S299XXA Unspecified injury of thorax, initial encounter: Secondary | ICD-10-CM | POA: Diagnosis present

## 2014-12-28 DIAGNOSIS — M1990 Unspecified osteoarthritis, unspecified site: Secondary | ICD-10-CM | POA: Insufficient documentation

## 2014-12-28 DIAGNOSIS — Y9389 Activity, other specified: Secondary | ICD-10-CM | POA: Insufficient documentation

## 2014-12-28 DIAGNOSIS — S20212A Contusion of left front wall of thorax, initial encounter: Secondary | ICD-10-CM

## 2014-12-28 DIAGNOSIS — I10 Essential (primary) hypertension: Secondary | ICD-10-CM | POA: Insufficient documentation

## 2014-12-28 DIAGNOSIS — R0781 Pleurodynia: Secondary | ICD-10-CM | POA: Diagnosis not present

## 2014-12-28 DIAGNOSIS — Y998 Other external cause status: Secondary | ICD-10-CM | POA: Diagnosis not present

## 2014-12-28 DIAGNOSIS — E119 Type 2 diabetes mellitus without complications: Secondary | ICD-10-CM | POA: Insufficient documentation

## 2014-12-28 DIAGNOSIS — L089 Local infection of the skin and subcutaneous tissue, unspecified: Secondary | ICD-10-CM

## 2014-12-28 HISTORY — DX: Unspecified osteoarthritis, unspecified site: M19.90

## 2014-12-28 HISTORY — DX: Type 2 diabetes mellitus without complications: E11.9

## 2014-12-28 HISTORY — DX: Essential (primary) hypertension: I10

## 2014-12-28 MED ORDER — BACITRACIN-NEOMYCIN-POLYMYXIN 400-5-5000 EX OINT
TOPICAL_OINTMENT | Freq: Once | CUTANEOUS | Status: AC
  Administered 2014-12-28: 1 via TOPICAL
  Filled 2014-12-28: qty 1

## 2014-12-28 NOTE — ED Notes (Signed)
MD at bedside. 

## 2014-12-28 NOTE — ED Notes (Addendum)
Pt c/o of fall against a wooden log which hit him in the LUQ, pain present worse with deep breaths.

## 2014-12-28 NOTE — ED Provider Notes (Signed)
CSN: [id number]     Arrival date & time 12/28/14  1812 History   First MD Initiated Contact with Patient 12/28/14 1831     Chief Complaint  Patient presents with  . Rib Injury     (Consider location/radiation/quality/duration/timing/severity/associated sxs/prior Treatment) The history is provided by the patient and the spouse.   Jake Becker is a 79 y.o. male with a past medical history including htn, hypothyroidism and DM presenting with pain in his left anterior chest which occurred suddenly around 3 pm today when he tripped over a pallet, landing against the corner with his left anterior rib cage.  He was out of town when the injury occurred, drove home prior to arriving here.  He denies loc, denies hitting his head and denies headache, neck pain, sob, nausea, abdominal pain, palpitations or weakness since the event.  He took 2 tylenol for pain prior to arrival.  He does have abrasions on his left lateral forearm sustained in the fall, denies pain at this site.  States his tetanus is utd.        Past Medical History  Diagnosis Date  . Hypertension   . Diabetes mellitus without complication   . Arthritis    History reviewed. No pertinent past surgical history. History reviewed. No pertinent family history. History  Substance Use Topics  . Smoking status: Not on file  . Smokeless tobacco: Not on file  . Alcohol Use: Not on file    Review of Systems  Constitutional: Negative for fever and diaphoresis.  HENT: Negative.   Respiratory: Negative for cough, choking and shortness of breath.   Cardiovascular: Positive for chest pain. Negative for palpitations.  Musculoskeletal: Positive for joint swelling and arthralgias. Negative for myalgias.  Skin: Positive for wound.  Neurological: Negative for weakness, light-headedness, numbness and headaches.      Allergies  Oxycodone  Home Medications   Prior to Admission medications   Medication Sig Start Date End Date Taking?  Authorizing Provider  atorvastatin (LIPITOR) 20 MG tablet Take 20 mg by mouth at bedtime. 12/24/14  Yes Historical Provider, MD  BENICAR HCT 40-12.5 MG per tablet Take 1 tablet by mouth at bedtime. 12/04/14  Yes Historical Provider, MD  levothyroxine (SYNTHROID, LEVOTHROID) 50 MCG tablet Take 50 mcg by mouth daily. 12/04/14  Yes Historical Provider, MD  meloxicam (MOBIC) 15 MG tablet Take 15 mg by mouth at bedtime. 12/20/14  Yes Historical Provider, MD  metFORMIN (GLUCOPHAGE) 500 MG tablet Take 500 mg by mouth 2 (two) times daily. 12/13/14  Yes Historical Provider, MD  predniSONE (STERAPRED UNI-PAK) 5 MG TABS tablet Take 5 mg by mouth daily. 12/20/14  Yes Historical Provider, MD   BP 139/62 mmHg  Pulse 75  Temp(Src) 98 F (36.7 C) (Oral)  Resp 18  Ht 5\' 5"  (1.651 m)  Wt 182 lb (82.555 kg)  BMI 30.29 kg/m2  SpO2 99% Physical Exam  Constitutional: He appears well-developed and well-nourished.  HENT:  Head: Normocephalic and atraumatic.  Eyes: Conjunctivae are normal.  Neck: Normal range of motion.  No palpable midline tenderness  Cardiovascular: Normal rate, regular rhythm, normal heart sounds and intact distal pulses.   Pulmonary/Chest: Effort normal and breath sounds normal. He has no wheezes. He has no rales. He exhibits tenderness.  Localized tenderness inferior to the left nipple.  There is no contusion, ecchymosis or palpable or visible deformity.  Breath sounds are clear bilaterally  Abdominal: Soft. Bowel sounds are normal. There is no tenderness.  No abdominal  tenderness.  Musculoskeletal: Normal range of motion.  Neurological: He is alert.  Skin: Skin is warm and dry.  2 small abrasions left upper lateral forearm.  Psychiatric: He has a normal mood and affect.  Nursing note and vitals reviewed.   ED Course  Procedures (including critical care time) Labs Review Labs Reviewed - No data to display  Imaging Review Dg Ribs Unilateral W/chest Left  12/28/2014   CLINICAL DATA:   Patient tripped on a wooden Pallet and fell. Left lateral rib pain.  EXAM: LEFT RIBS AND CHEST - 3+ VIEW  COMPARISON:  Chest 02/05/2010  FINDINGS: Shallow inspiration with linear atelectasis in the lung bases. Probable emphysematous changes in the lungs. Normal heart size and pulmonary vascularity. Calcified and tortuous aorta. No focal airspace disease or consolidation. No pleural effusions. No pneumothorax. Old right rib fractures.  Left ribs appear intact. No displaced fractures or focal bone abnormalities demonstrated.  IMPRESSION: Shallow inspiration with linear atelectasis in the lung bases. Emphysematous changes. No evidence of active pulmonary disease. No displaced left rib fractures.   Electronically Signed   By: Lucienne Capers M.D.   On: 12/28/2014 19:31     EKG Interpretation None      MDM   Final diagnoses:  Rib contusion, left, initial encounter  Forearm abrasion, infected, left, initial encounter    Patients labs and/or radiological studies were viewed and considered during the medical decision making and disposition process. Pt advised ice tx x 2 days, may add heat in 2-3 days prn. F/u with pcp or return here for any worsened sx.  Discussed tetanus - chart documentation implies out of date.  Pt states he has had one recently. Advised he and wife to call pcp in am to confirm tetanus is current.  PRN f/u anticipated.  Discussed with Dr. Alvino Chapel prior to dc home.    Evalee Jefferson, PA-C 12/28/14 2003  Jasper Riling. Alvino Chapel, MD 12/29/14 2333

## 2014-12-28 NOTE — Discharge Instructions (Signed)
Rib Contusion °A rib contusion (bruise) can occur by a blow to the chest or by a fall against a hard object. Usually these will be much better in a couple weeks. If X-rays were taken today and there are no broken bones (fractures), the diagnosis of bruising is made. However, broken ribs may not show up for several days, or may be discovered later on a routine X-ray when signs of healing show up. If this happens to you, it does not mean that something was missed on the X-ray, but simply that it did not show up on the first X-rays. Earlier diagnosis will not usually change the treatment. °HOME CARE INSTRUCTIONS  °· Avoid strenuous activity. Be careful during activities and avoid bumping the injured ribs. Activities that pull on the injured ribs and cause pain should be avoided, if possible. °· For the first day or two, an ice pack used every 20 minutes while awake may be helpful. Put ice in a plastic bag and put a towel between the bag and the skin. °· Eat a normal, well-balanced diet. Drink plenty of fluids to avoid constipation. °· Take deep breaths several times a day to keep lungs free of infection. Try to cough several times a day. Splint the injured area with a pillow while coughing to ease pain. Coughing can help prevent pneumonia. °· Wear a rib belt or binder only if told to do so by your caregiver. If you are wearing a rib belt or binder, you must do the breathing exercises as directed by your caregiver. If not used properly, rib belts or binders restrict breathing which can lead to pneumonia. °· Only take over-the-counter or prescription medicines for pain, discomfort, or fever as directed by your caregiver. °SEEK MEDICAL CARE IF:  °· You or your child has an oral temperature above 102° F (38.9° C). °· Your baby is older than 3 months with a rectal temperature of 100.5° F (38.1° C) or higher for more than 1 day. °· You develop a cough, with thick or bloody sputum. °SEEK IMMEDIATE MEDICAL CARE IF:  °· You  have difficulty breathing. °· You feel sick to your stomach (nausea), have vomiting or belly (abdominal) pain. °· You have worsening pain, not controlled with medications, or there is a change in the location of the pain. °· You develop sweating or radiation of the pain into the arms, jaw or shoulders, or become light headed or faint. °· You or your child has an oral temperature above 102° F (38.9° C), not controlled by medicine. °· Your or your baby is older than 3 months with a rectal temperature of 102° F (38.9° C) or higher. °· Your baby is 3 months old or younger with a rectal temperature of 100.4° F (38° C) or higher. °MAKE SURE YOU:  °· Understand these instructions. °· Will watch your condition. °· Will get help right away if you are not doing well or get worse. °Document Released: 07/30/2001 Document Revised: 03/01/2013 Document Reviewed: 06/22/2008 °[employer]® Patient Information ©2015 [employer]. This information is not intended to replace advice given to you by your health care provider. Make sure you discuss any questions you have with your health care provider. ° °

## 2015-01-03 DIAGNOSIS — H34831 Tributary (branch) retinal vein occlusion, right eye: Secondary | ICD-10-CM | POA: Diagnosis not present

## 2015-01-03 DIAGNOSIS — E119 Type 2 diabetes mellitus without complications: Secondary | ICD-10-CM | POA: Diagnosis not present

## 2015-01-03 DIAGNOSIS — H3521 Other non-diabetic proliferative retinopathy, right eye: Secondary | ICD-10-CM | POA: Diagnosis not present

## 2015-01-31 DIAGNOSIS — M170 Bilateral primary osteoarthritis of knee: Secondary | ICD-10-CM | POA: Diagnosis not present

## 2015-02-13 ENCOUNTER — Encounter (INDEPENDENT_AMBULATORY_CARE_PROVIDER_SITE_OTHER): Payer: Self-pay | Admitting: *Deleted

## 2015-03-20 ENCOUNTER — Ambulatory Visit (HOSPITAL_COMMUNITY)
Admission: RE | Admit: 2015-03-20 | Discharge: 2015-03-20 | Disposition: A | Discharge status: Home / Self Care | Payer: Medicare Other | Source: Ambulatory Visit | Attending: Orthopedic Surgery | Admitting: Orthopedic Surgery

## 2015-03-20 ENCOUNTER — Other Ambulatory Visit (HOSPITAL_COMMUNITY): Payer: Self-pay | Admitting: Orthopedic Surgery

## 2015-03-20 DIAGNOSIS — M25562 Pain in left knee: Secondary | ICD-10-CM

## 2015-03-20 DIAGNOSIS — Z01818 Encounter for other preprocedural examination: Secondary | ICD-10-CM | POA: Diagnosis present

## 2015-03-20 DIAGNOSIS — M1712 Unilateral primary osteoarthritis, left knee: Secondary | ICD-10-CM | POA: Diagnosis not present

## 2015-03-23 DIAGNOSIS — E6609 Other obesity due to excess calories: Secondary | ICD-10-CM | POA: Diagnosis not present

## 2015-03-23 DIAGNOSIS — E782 Mixed hyperlipidemia: Secondary | ICD-10-CM | POA: Diagnosis not present

## 2015-03-23 DIAGNOSIS — E039 Hypothyroidism, unspecified: Secondary | ICD-10-CM | POA: Diagnosis not present

## 2015-03-23 DIAGNOSIS — Z6830 Body mass index (BMI) 30.0-30.9, adult: Secondary | ICD-10-CM | POA: Diagnosis not present

## 2015-03-23 DIAGNOSIS — Z23 Encounter for immunization: Secondary | ICD-10-CM | POA: Diagnosis not present

## 2015-03-23 DIAGNOSIS — E119 Type 2 diabetes mellitus without complications: Secondary | ICD-10-CM | POA: Diagnosis not present

## 2015-03-23 DIAGNOSIS — Z1389 Encounter for screening for other disorder: Secondary | ICD-10-CM | POA: Diagnosis not present

## 2015-03-23 DIAGNOSIS — Z0000 Encounter for general adult medical examination without abnormal findings: Secondary | ICD-10-CM | POA: Diagnosis not present

## 2015-03-28 DIAGNOSIS — M25562 Pain in left knee: Secondary | ICD-10-CM | POA: Diagnosis not present

## 2015-04-03 DIAGNOSIS — M1712 Unilateral primary osteoarthritis, left knee: Secondary | ICD-10-CM | POA: Diagnosis not present

## 2015-06-26 DIAGNOSIS — E119 Type 2 diabetes mellitus without complications: Secondary | ICD-10-CM | POA: Diagnosis not present

## 2015-06-26 DIAGNOSIS — Z1389 Encounter for screening for other disorder: Secondary | ICD-10-CM | POA: Diagnosis not present

## 2015-06-26 DIAGNOSIS — Z6829 Body mass index (BMI) 29.0-29.9, adult: Secondary | ICD-10-CM | POA: Diagnosis not present

## 2015-06-26 DIAGNOSIS — Z125 Encounter for screening for malignant neoplasm of prostate: Secondary | ICD-10-CM | POA: Diagnosis not present

## 2015-06-26 DIAGNOSIS — E039 Hypothyroidism, unspecified: Secondary | ICD-10-CM | POA: Diagnosis not present

## 2015-06-26 DIAGNOSIS — E663 Overweight: Secondary | ICD-10-CM | POA: Diagnosis not present

## 2015-06-28 DIAGNOSIS — E119 Type 2 diabetes mellitus without complications: Secondary | ICD-10-CM | POA: Diagnosis not present

## 2015-06-28 DIAGNOSIS — E782 Mixed hyperlipidemia: Secondary | ICD-10-CM | POA: Diagnosis not present

## 2015-09-25 DIAGNOSIS — H01001 Unspecified blepharitis right upper eyelid: Secondary | ICD-10-CM | POA: Diagnosis not present

## 2015-09-25 DIAGNOSIS — H3500 Unspecified background retinopathy: Secondary | ICD-10-CM | POA: Diagnosis not present

## 2015-09-25 DIAGNOSIS — E119 Type 2 diabetes mellitus without complications: Secondary | ICD-10-CM | POA: Diagnosis not present

## 2015-09-25 DIAGNOSIS — H31001 Unspecified chorioretinal scars, right eye: Secondary | ICD-10-CM | POA: Diagnosis not present

## 2015-11-07 DIAGNOSIS — Z23 Encounter for immunization: Secondary | ICD-10-CM | POA: Diagnosis not present

## 2015-11-07 DIAGNOSIS — E663 Overweight: Secondary | ICD-10-CM | POA: Diagnosis not present

## 2015-11-07 DIAGNOSIS — I10 Essential (primary) hypertension: Secondary | ICD-10-CM | POA: Diagnosis not present

## 2015-11-07 DIAGNOSIS — Z6829 Body mass index (BMI) 29.0-29.9, adult: Secondary | ICD-10-CM | POA: Diagnosis not present

## 2015-11-07 DIAGNOSIS — E039 Hypothyroidism, unspecified: Secondary | ICD-10-CM | POA: Diagnosis not present

## 2015-11-07 DIAGNOSIS — Z1389 Encounter for screening for other disorder: Secondary | ICD-10-CM | POA: Diagnosis not present

## 2015-11-07 DIAGNOSIS — E782 Mixed hyperlipidemia: Secondary | ICD-10-CM | POA: Diagnosis not present

## 2015-11-07 DIAGNOSIS — E119 Type 2 diabetes mellitus without complications: Secondary | ICD-10-CM | POA: Diagnosis not present

## 2015-11-26 ENCOUNTER — Emergency Department (HOSPITAL_COMMUNITY)
Admission: EM | Admit: 2015-11-26 | Discharge: 2015-11-26 | Disposition: A | Discharge status: Home / Self Care | Payer: Medicare Other | Attending: Emergency Medicine | Admitting: Emergency Medicine

## 2015-11-26 ENCOUNTER — Encounter (HOSPITAL_COMMUNITY): Payer: Self-pay | Admitting: *Deleted

## 2015-11-26 ENCOUNTER — Emergency Department (HOSPITAL_COMMUNITY): Payer: Medicare Other

## 2015-11-26 DIAGNOSIS — H538 Other visual disturbances: Secondary | ICD-10-CM | POA: Diagnosis present

## 2015-11-26 DIAGNOSIS — Z79899 Other long term (current) drug therapy: Secondary | ICD-10-CM | POA: Diagnosis not present

## 2015-11-26 DIAGNOSIS — E119 Type 2 diabetes mellitus without complications: Secondary | ICD-10-CM | POA: Insufficient documentation

## 2015-11-26 DIAGNOSIS — E079 Disorder of thyroid, unspecified: Secondary | ICD-10-CM | POA: Diagnosis not present

## 2015-11-26 DIAGNOSIS — I10 Essential (primary) hypertension: Secondary | ICD-10-CM | POA: Insufficient documentation

## 2015-11-26 DIAGNOSIS — H532 Diplopia: Secondary | ICD-10-CM | POA: Diagnosis not present

## 2015-11-26 DIAGNOSIS — Z8739 Personal history of other diseases of the musculoskeletal system and connective tissue: Secondary | ICD-10-CM | POA: Diagnosis not present

## 2015-11-26 HISTORY — DX: Disorder of thyroid, unspecified: E07.9

## 2015-11-26 NOTE — ED Notes (Signed)
Patient given discharge instruction, verbalized understand. Patient ambulatory out of the department.  

## 2015-11-26 NOTE — ED Notes (Signed)
Pt c/o high blood pressure x 3 days.

## 2015-11-26 NOTE — ED Provider Notes (Signed)
CSN: [id number]     Arrival date & time 11/26/15  1909 History   First MD Initiated Contact with Patient 11/26/15 2022     Chief Complaint  Patient presents with  . Hypertension      Patient is a 80 y.o. male presenting with hypertension. The history is provided by the patient.  Hypertension Pertinent negatives include no chest pain, no abdominal pain and no shortness of breath.   patient presents with high blood pressure. Has been high for the last several days. Wife has been measuring him and it goes up to 1 [id number] systolic. States his face feels little flushed when he gets it. No chest pain. No trouble breathing. He has had some mildly increased swelling in his legs. States he does have low thyroid and recently had increase of his medication. Then the blood pressure went up after increasing the Synthroid. However the Synthroid was decreased and the blood pressure stayed up. Patient states he occasionally has his feet stumble into things. States also he has had 2 episodes recent double vision. No double vision appeared to be worse when you look to the right. Since he had some bleeding as I 7 years ago has had some visual changes. Never had double vision before the last several days. No headache. No confusion.  Past Medical History  Diagnosis Date  . Hypertension   . Diabetes mellitus without complication ([place])   . Arthritis   . Thyroid disease    History reviewed. No pertinent past surgical history. History reviewed. No pertinent family history. Social History  Substance Use Topics  . Smoking status: Never Smoker   . Smokeless tobacco: None  . Alcohol Use: No    Review of Systems  Constitutional: Negative for appetite change.  Eyes: Positive for visual disturbance.  Respiratory: Negative for chest tightness and shortness of breath.   Cardiovascular: Negative for chest pain and leg swelling.  Gastrointestinal: Negative for abdominal pain.  Genitourinary: Negative for enuresis.   Musculoskeletal: Negative for back pain.  Skin: Negative for wound.  Neurological: Negative for light-headedness.  Hematological: Negative for adenopathy.      Allergies  Oxycodone  Home Medications   Prior to Admission medications   Medication Sig Start Date End Date Taking? Authorizing Provider  atorvastatin (LIPITOR) 20 MG tablet Take 10 mg by mouth daily after supper.  12/24/14  Yes Historical Provider, MD  BENICAR 40 MG tablet Take 40 mg by mouth daily. 11/16/15  Yes Historical Provider, MD  Cinnamon 500 MG capsule Take 500 mg by mouth daily.   Yes Historical Provider, MD  levothyroxine (SYNTHROID, LEVOTHROID) 50 MCG tablet Take 50 mcg by mouth daily before breakfast.   Yes Historical Provider, MD  metFORMIN (GLUCOPHAGE) 500 MG tablet Take 500 mg by mouth 2 (two) times daily. 12/13/14  Yes Historical Provider, MD  Polyethyl Glycol-Propyl Glycol (SYSTANE) 0.4-0.3 % SOLN Apply 1 drop to eye every evening.   Yes Historical Provider, MD  psyllium (REGULOID) 0.52 g capsule Take 0.52 g by mouth daily.   Yes Historical Provider, MD   BP 160/76 mmHg  Pulse 64  Temp(Src) 98 F (36.7 C) (Oral)  Resp 20  Ht 5\' 5"  (1.651 m)  Wt 178 lb (80.74 kg)  BMI 29.62 kg/m2  SpO2 96% Physical Exam  HENT:  Head: Atraumatic.  Eyes: EOM are normal. Pupils are equal, round, and reactive to light. Right eye exhibits no discharge. Left eye exhibits no discharge.  Neck: Neck supple.  Cardiovascular: Normal rate.  Pulmonary/Chest: Effort normal.  Abdominal: Soft.  Musculoskeletal: Normal range of motion.  Neurological: He is alert. No cranial nerve deficit.  Extraocular movements intact. Pupils reactive. Face symmetric. Good grips bilaterally. Normal gait.  Skin: Skin is warm.    ED Course  Procedures (including critical care time) Labs Review Labs Reviewed - No data to display  Imaging Review Ct Head Wo Contrast  11/26/2015  CLINICAL DATA:  Double vision.  High blood pressure for 3 days.  EXAM: CT HEAD WITHOUT CONTRAST TECHNIQUE: Contiguous axial images were obtained from the base of the skull through the vertex without intravenous contrast. COMPARISON:  None. FINDINGS: Generalized atrophy and chronic small vessel ischemia, normal for age.No intracranial hemorrhage, mass effect, or midline shift. No hydrocephalus. The basilar cisterns are patent. No evidence of territorial infarct. No intracranial fluid collection. Calvarium is intact. Scattered opacification of ethmoid air cells on lower right mastoid air cells. No fluid levels. IMPRESSION: Atrophy and chronic small vessel ischemia, normal for age. No acute intracranial abnormality. Electronically Signed   By: Jeb Levering M.D.   On: 11/26/2015 22:00   I have personally reviewed and evaluated these images and lab results as part of my medical decision-making.   EKG Interpretation None      MDM   Final diagnoses:  Essential hypertension    Hypertension. Blood pressure improved here. Will restart on former medication. Is closely followed by his PCP. Also had some double vision and stumbling. Head CT reassuring. Will have follow-up with his PCP for this.    Davonna Belling, MD 11/26/15 [phone]

## 2015-11-26 NOTE — Discharge Instructions (Signed)
Start back on your other blood pressure medicine. Follow-up with Dr. Hilma Favors for occasional double vision and stumbling.  Hypertension Hypertension, commonly called high blood pressure, is when the force of blood pumping through your arteries is too strong. Your arteries are the blood vessels that carry blood from your heart throughout your body. A blood pressure reading consists of a higher number over a lower number, such as 110/72. The higher number (systolic) is the pressure inside your arteries when your heart pumps. The lower number (diastolic) is the pressure inside your arteries when your heart relaxes. Ideally you want your blood pressure below 120/80. Hypertension forces your heart to work harder to pump blood. Your arteries may become narrow or stiff. Having untreated or uncontrolled hypertension can cause heart attack, stroke, kidney disease, and other problems. RISK FACTORS Some risk factors for high blood pressure are controllable. Others are not.  Risk factors you cannot control include:   Race. You may be at higher risk if you are African American.  Age. Risk increases with age.  Gender. Men are at higher risk than women before age 61 years. After age 51, women are at higher risk than men. Risk factors you can control include:  Not getting enough exercise or physical activity.  Being overweight.  Getting too much fat, sugar, calories, or salt in your diet.  Drinking too much alcohol. SIGNS AND SYMPTOMS Hypertension does not usually cause signs or symptoms. Extremely high blood pressure (hypertensive crisis) may cause headache, anxiety, shortness of breath, and nosebleed. DIAGNOSIS To check if you have hypertension, your health care provider will measure your blood pressure while you are seated, with your arm held at the level of your heart. It should be measured at least twice using the same arm. Certain conditions can cause a difference in blood pressure between your right  and left arms. A blood pressure reading that is higher than normal on one occasion does not mean that you need treatment. If it is not clear whether you have high blood pressure, you may be asked to return on a different day to have your blood pressure checked again. Or, you may be asked to monitor your blood pressure at home for 1 or more weeks. TREATMENT Treating high blood pressure includes making lifestyle changes and possibly taking medicine. Living a healthy lifestyle can help lower high blood pressure. You may need to change some of your habits. Lifestyle changes may include:  Following the DASH diet. This diet is high in fruits, vegetables, and whole grains. It is low in salt, red meat, and added sugars.  Keep your sodium intake below 2,300 mg per day.  Getting at least 30-45 minutes of aerobic exercise at least 4 times per week.  Losing weight if necessary.  Not smoking.  Limiting alcoholic beverages.  Learning ways to reduce stress. Your health care provider may prescribe medicine if lifestyle changes are not enough to get your blood pressure under control, and if one of the following is true:  You are 14-34 years of age and your systolic blood pressure is above 140.  You are 14 years of age or older, and your systolic blood pressure is above 150.  Your diastolic blood pressure is above 90.  You have diabetes, and your systolic blood pressure is over [id number] or your diastolic blood pressure is over 90.  You have kidney disease and your blood pressure is above 140/90.  You have heart disease and your blood pressure is above 140/90. Your  personal target blood pressure may vary depending on your medical conditions, your age, and other factors. HOME CARE INSTRUCTIONS  Have your blood pressure rechecked as directed by your health care provider.   Take medicines only as directed by your health care provider. Follow the directions carefully. Blood pressure medicines must be taken  as prescribed. The medicine does not work as well when you skip doses. Skipping doses also puts you at risk for problems.  Do not smoke.   Monitor your blood pressure at home as directed by your health care provider. SEEK MEDICAL CARE IF:   You think you are having a reaction to medicines taken.  You have recurrent headaches or feel dizzy.  You have swelling in your ankles.  You have trouble with your vision. SEEK IMMEDIATE MEDICAL CARE IF:  You develop a severe headache or confusion.  You have unusual weakness, numbness, or feel faint.  You have severe chest or abdominal pain.  You vomit repeatedly.  You have trouble breathing. MAKE SURE YOU:   Understand these instructions.  Will watch your condition.  Will get help right away if you are not doing well or get worse.   This information is not intended to replace advice given to you by your health care provider. Make sure you discuss any questions you have with your health care provider.   Document Released: 11/04/2005 Document Revised: 03/21/2015 Document Reviewed: 08/27/2013 Elsevier Interactive Patient Education [employer].

## 2015-11-26 NOTE — ED Notes (Signed)
Per wife pt has gained 4 lbs and seems puffy in the face and larger abdomen, concerned he is retaining  fluid

## 2015-12-18 DIAGNOSIS — E119 Type 2 diabetes mellitus without complications: Secondary | ICD-10-CM | POA: Diagnosis not present

## 2015-12-18 DIAGNOSIS — H3521 Other non-diabetic proliferative retinopathy, right eye: Secondary | ICD-10-CM | POA: Diagnosis not present

## 2015-12-18 DIAGNOSIS — H348312 Tributary (branch) retinal vein occlusion, right eye, stable: Secondary | ICD-10-CM | POA: Diagnosis not present

## 2015-12-18 DIAGNOSIS — H43392 Other vitreous opacities, left eye: Secondary | ICD-10-CM | POA: Diagnosis not present

## 2015-12-21 DIAGNOSIS — Z1389 Encounter for screening for other disorder: Secondary | ICD-10-CM | POA: Diagnosis not present

## 2015-12-21 DIAGNOSIS — Z6830 Body mass index (BMI) 30.0-30.9, adult: Secondary | ICD-10-CM | POA: Diagnosis not present

## 2015-12-21 DIAGNOSIS — E039 Hypothyroidism, unspecified: Secondary | ICD-10-CM | POA: Diagnosis not present

## 2015-12-21 DIAGNOSIS — E6609 Other obesity due to excess calories: Secondary | ICD-10-CM | POA: Diagnosis not present

## 2016-02-14 DIAGNOSIS — E119 Type 2 diabetes mellitus without complications: Secondary | ICD-10-CM | POA: Diagnosis not present

## 2016-02-14 DIAGNOSIS — I10 Essential (primary) hypertension: Secondary | ICD-10-CM | POA: Diagnosis not present

## 2016-02-14 DIAGNOSIS — E039 Hypothyroidism, unspecified: Secondary | ICD-10-CM | POA: Diagnosis not present

## 2016-02-14 DIAGNOSIS — Z6830 Body mass index (BMI) 30.0-30.9, adult: Secondary | ICD-10-CM | POA: Diagnosis not present

## 2016-02-14 DIAGNOSIS — Z1389 Encounter for screening for other disorder: Secondary | ICD-10-CM | POA: Diagnosis not present

## 2016-02-14 DIAGNOSIS — E782 Mixed hyperlipidemia: Secondary | ICD-10-CM | POA: Diagnosis not present

## 2016-02-20 ENCOUNTER — Encounter (INDEPENDENT_AMBULATORY_CARE_PROVIDER_SITE_OTHER): Payer: Self-pay | Admitting: *Deleted

## 2016-03-07 ENCOUNTER — Encounter (INDEPENDENT_AMBULATORY_CARE_PROVIDER_SITE_OTHER): Payer: Self-pay | Admitting: *Deleted

## 2016-03-07 ENCOUNTER — Other Ambulatory Visit (INDEPENDENT_AMBULATORY_CARE_PROVIDER_SITE_OTHER): Payer: Self-pay | Admitting: *Deleted

## 2016-03-07 DIAGNOSIS — Z1211 Encounter for screening for malignant neoplasm of colon: Secondary | ICD-10-CM

## 2016-03-26 ENCOUNTER — Other Ambulatory Visit (HOSPITAL_COMMUNITY): Payer: Self-pay | Admitting: Family Medicine

## 2016-03-26 ENCOUNTER — Ambulatory Visit (HOSPITAL_COMMUNITY)
Admission: RE | Admit: 2016-03-26 | Discharge: 2016-03-26 | Disposition: A | Discharge status: Home / Self Care | Payer: Medicare Other | Source: Ambulatory Visit | Attending: Family Medicine | Admitting: Family Medicine

## 2016-03-26 DIAGNOSIS — R079 Chest pain, unspecified: Secondary | ICD-10-CM | POA: Diagnosis not present

## 2016-03-26 DIAGNOSIS — M940 Chondrocostal junction syndrome [Tietze]: Secondary | ICD-10-CM | POA: Diagnosis not present

## 2016-03-26 DIAGNOSIS — Z6829 Body mass index (BMI) 29.0-29.9, adult: Secondary | ICD-10-CM | POA: Insufficient documentation

## 2016-03-26 DIAGNOSIS — R0789 Other chest pain: Secondary | ICD-10-CM | POA: Diagnosis present

## 2016-03-26 DIAGNOSIS — Z1389 Encounter for screening for other disorder: Secondary | ICD-10-CM | POA: Insufficient documentation

## 2016-03-26 DIAGNOSIS — E663 Overweight: Secondary | ICD-10-CM | POA: Diagnosis not present

## 2016-04-01 DIAGNOSIS — Z6830 Body mass index (BMI) 30.0-30.9, adult: Secondary | ICD-10-CM | POA: Diagnosis not present

## 2016-04-01 DIAGNOSIS — Z0000 Encounter for general adult medical examination without abnormal findings: Secondary | ICD-10-CM | POA: Diagnosis not present

## 2016-04-01 DIAGNOSIS — E663 Overweight: Secondary | ICD-10-CM | POA: Diagnosis not present

## 2016-04-03 DIAGNOSIS — L570 Actinic keratosis: Secondary | ICD-10-CM | POA: Diagnosis not present

## 2016-04-03 DIAGNOSIS — C44319 Basal cell carcinoma of skin of other parts of face: Secondary | ICD-10-CM | POA: Diagnosis not present

## 2016-04-03 DIAGNOSIS — X32XXXD Exposure to sunlight, subsequent encounter: Secondary | ICD-10-CM | POA: Diagnosis not present

## 2016-04-03 DIAGNOSIS — D225 Melanocytic nevi of trunk: Secondary | ICD-10-CM | POA: Diagnosis not present

## 2016-04-26 ENCOUNTER — Other Ambulatory Visit (INDEPENDENT_AMBULATORY_CARE_PROVIDER_SITE_OTHER): Payer: Self-pay | Admitting: *Deleted

## 2016-04-26 ENCOUNTER — Encounter (INDEPENDENT_AMBULATORY_CARE_PROVIDER_SITE_OTHER): Payer: Self-pay | Admitting: *Deleted

## 2016-04-26 NOTE — Telephone Encounter (Signed)
Patient needs trilyte 

## 2016-04-29 MED ORDER — PEG 3350-KCL-NA BICARB-NACL 420 G PO SOLR: 4000.0000 mL | Freq: Once | ORAL | Status: DC

## 2016-05-08 ENCOUNTER — Telehealth (INDEPENDENT_AMBULATORY_CARE_PROVIDER_SITE_OTHER): Payer: Self-pay | Admitting: *Deleted

## 2016-05-08 NOTE — Telephone Encounter (Signed)
Referring MD/PCP: golding   Procedure: tcs  Reason/Indication:  screening  Has patient had this procedure before?  Yes, 2006  If so, when, by whom and where?    Is there a family history of colon cancer?  no  Who?  What age when diagnosed?    Is patient diabetic?   yes      Does patient have prosthetic heart valve or mechanical valve?  no  Do you have a pacemaker?  no  Has patient ever had endocarditis? no  Has patient had joint replacement within last 12 months?  no  Does patient tend to be constipated or take laxatives? no  Does patient have a history of alcohol/drug use?  no  Is patient on Coumadin, Plavix and/or Aspirin? no  Medications: see epic  Allergies: oxycodone  Medication Adjustment: hold metformin evening before and morning of procedure  Procedure date & time: 06/06/16 at 730

## 2016-05-09 NOTE — Telephone Encounter (Signed)
agree

## 2016-05-16 DIAGNOSIS — X32XXXD Exposure to sunlight, subsequent encounter: Secondary | ICD-10-CM | POA: Diagnosis not present

## 2016-05-16 DIAGNOSIS — L570 Actinic keratosis: Secondary | ICD-10-CM | POA: Diagnosis not present

## 2016-06-06 ENCOUNTER — Encounter (HOSPITAL_COMMUNITY)
Admission: RE | Disposition: A | Discharge status: Home / Self Care | Payer: Self-pay | Source: Ambulatory Visit | Attending: Internal Medicine

## 2016-06-06 ENCOUNTER — Ambulatory Visit (HOSPITAL_COMMUNITY)
Admission: RE | Admit: 2016-06-06 | Discharge: 2016-06-06 | Disposition: A | Discharge status: Home / Self Care | Payer: Medicare Other | Source: Ambulatory Visit | Attending: Internal Medicine | Admitting: Internal Medicine

## 2016-06-06 ENCOUNTER — Encounter (HOSPITAL_COMMUNITY): Payer: Self-pay | Admitting: *Deleted

## 2016-06-06 DIAGNOSIS — Z79899 Other long term (current) drug therapy: Secondary | ICD-10-CM | POA: Insufficient documentation

## 2016-06-06 DIAGNOSIS — K5730 Diverticulosis of large intestine without perforation or abscess without bleeding: Secondary | ICD-10-CM | POA: Insufficient documentation

## 2016-06-06 DIAGNOSIS — M1990 Unspecified osteoarthritis, unspecified site: Secondary | ICD-10-CM | POA: Insufficient documentation

## 2016-06-06 DIAGNOSIS — E039 Hypothyroidism, unspecified: Secondary | ICD-10-CM | POA: Insufficient documentation

## 2016-06-06 DIAGNOSIS — Z1211 Encounter for screening for malignant neoplasm of colon: Secondary | ICD-10-CM | POA: Diagnosis not present

## 2016-06-06 DIAGNOSIS — K648 Other hemorrhoids: Secondary | ICD-10-CM | POA: Insufficient documentation

## 2016-06-06 DIAGNOSIS — Z7984 Long term (current) use of oral hypoglycemic drugs: Secondary | ICD-10-CM | POA: Diagnosis not present

## 2016-06-06 DIAGNOSIS — E119 Type 2 diabetes mellitus without complications: Secondary | ICD-10-CM | POA: Diagnosis not present

## 2016-06-06 DIAGNOSIS — K644 Residual hemorrhoidal skin tags: Secondary | ICD-10-CM | POA: Insufficient documentation

## 2016-06-06 DIAGNOSIS — I10 Essential (primary) hypertension: Secondary | ICD-10-CM | POA: Diagnosis not present

## 2016-06-06 HISTORY — PX: COLONOSCOPY: SHX5424

## 2016-06-06 HISTORY — DX: Hypothyroidism, unspecified: E03.9

## 2016-06-06 LAB — GLUCOSE, CAPILLARY: GLUCOSE-CAPILLARY: 107 mg/dL — AB (ref 65–99)

## 2016-06-06 SURGERY — COLONOSCOPY
Anesthesia: Moderate Sedation

## 2016-06-06 MED ORDER — MEPERIDINE HCL 50 MG/ML IJ SOLN
INTRAMUSCULAR | Status: DC | PRN
  Administered 2016-06-06 (×2): 25 mg via INTRAVENOUS

## 2016-06-06 MED ORDER — MIDAZOLAM HCL 5 MG/5ML IJ SOLN
INTRAMUSCULAR | Status: DC | PRN
  Administered 2016-06-06: 1 mg via INTRAVENOUS
  Administered 2016-06-06: 2 mg via INTRAVENOUS
  Administered 2016-06-06: 1 mg via INTRAVENOUS

## 2016-06-06 MED ORDER — SODIUM CHLORIDE 0.9 % IV SOLN
INTRAVENOUS | Status: DC
  Administered 2016-06-06: 1000 mL via INTRAVENOUS

## 2016-06-06 MED ORDER — MIDAZOLAM HCL 5 MG/5ML IJ SOLN
INTRAMUSCULAR | Status: AC
  Filled 2016-06-06: qty 10

## 2016-06-06 MED ORDER — STERILE WATER FOR IRRIGATION IR SOLN
Status: DC | PRN
  Administered 2016-06-06: 08:00:00

## 2016-06-06 MED ORDER — MEPERIDINE HCL 50 MG/ML IJ SOLN
INTRAMUSCULAR | Status: AC
  Filled 2016-06-06: qty 1

## 2016-06-06 NOTE — Discharge Instructions (Signed)
Resume usual medications and high fiber diet. Consider dropping meloxicam dose if possible. Please discuss with Dr. Hilma Favors. No driving for 24 hours.   Colonoscopy, Care After Refer to this sheet in the next few weeks. These instructions provide you with information on caring for yourself after your procedure. Your health care provider may also give you more specific instructions. Your treatment has been planned according to current medical practices, but problems sometimes occur. Call your health care provider if you have any problems or questions after your procedure. WHAT TO EXPECT AFTER THE PROCEDURE  After your procedure, it is typical to have the following:  A small amount of blood in your stool.  Moderate amounts of gas and mild abdominal cramping or bloating. HOME CARE INSTRUCTIONS  Do not drive, operate machinery, or sign important documents for 24 hours.  You may shower and resume your regular physical activities, but move at a slower pace for the first 24 hours.  Take frequent rest periods for the first 24 hours.  Walk around or put a warm pack on your abdomen to help reduce abdominal cramping and bloating.  Drink enough fluids to keep your urine clear or pale yellow.  You may resume your normal diet as instructed by your health care provider. Avoid heavy or fried foods that are hard to digest.  Avoid drinking alcohol for 24 hours or as instructed by your health care provider.  Only take over-the-counter or prescription medicines as directed by your health care provider.  If a tissue sample (biopsy) was taken during your procedure:  Do not take aspirin or blood thinners for 7 days, or as instructed by your health care provider.  Do not drink alcohol for 7 days, or as instructed by your health care provider.  Eat soft foods for the first 24 hours. SEEK MEDICAL CARE IF: You have persistent spotting of blood in your stool 2-3 days after the procedure. SEEK IMMEDIATE  MEDICAL CARE IF:  You have more than a small spotting of blood in your stool.  You pass large blood clots in your stool.  Your abdomen is swollen (distended).  You have nausea or vomiting.  You have a fever.  You have increasing abdominal pain that is not relieved with medicine.   This information is not intended to replace advice given to you by your health care provider. Make sure you discuss any questions you have with your health care provider.   Document Released: 06/18/2004 Document Revised: 08/25/2013 Document Reviewed: 07/12/2013 Elsevier Interactive Patient Education [employer].

## 2016-06-06 NOTE — H&P (Signed)
Jake Becker is an 80 y.o. male.   Chief Complaint: Patient is here for colonoscopy. HPI: Patient is 80 year old Caucasian male who is here for screening colonoscopy. He denies abdominal pain change in bowel habits or rectal bleeding. Last colonoscopy was 11 years ago. Family history is negative for CRC.  Past Medical History  Diagnosis Date  . Hypertension   . Diabetes mellitus without complication ([place])   . Arthritis   . Thyroid disease   . Hypothyroidism     Past Surgical History  Procedure Laterality Date  . Eye surgery      cataract    History reviewed. No pertinent family history. Social History:  reports that he has never smoked. He does not have any smokeless tobacco history on file. He reports that he does not drink alcohol or use illicit drugs.  Allergies:  Allergies  Allergen Reactions  . Oxycodone Other (See Comments)    Pt.see things    Medications Prior to Admission  Medication Sig Dispense Refill  . BENICAR 40 MG tablet Take 40 mg by mouth daily.  9  . levothyroxine (SYNTHROID, LEVOTHROID) 50 MCG tablet Take 50 mcg by mouth daily before breakfast.    . meloxicam (MOBIC) 15 MG tablet Take 15 mg by mouth daily.    Vladimir Faster Glycol-Propyl Glycol (SYSTANE) 0.4-0.3 % SOLN Apply 1 drop to eye every evening.    Marland Kitchen atorvastatin (LIPITOR) 20 MG tablet Take 10 mg by mouth daily after supper.     . Cinnamon 500 MG capsule Take 500 mg by mouth daily.    . metFORMIN (GLUCOPHAGE) 500 MG tablet Take 500 mg by mouth 2 (two) times daily.  3  . polyethylene glycol-electrolytes (TRILYTE) 420 g solution Take 4,000 mLs by mouth once. 4000 mL 0  . psyllium (REGULOID) 0.52 g capsule Take 0.52 g by mouth daily.      Results for orders placed or performed during the hospital encounter of 06/06/16 (from the past 48 hour(s))  Glucose, capillary     Status: Abnormal   Collection Time: 06/06/16  6:59 AM  Result Value Ref Range   Glucose-Capillary 107 (H) 65 - 99 mg/dL   No  results found.  ROS  Blood pressure 173/82, pulse 50, temperature 97.8 F (36.6 C), temperature source Oral, resp. rate 15, height 5\' 4"  (1.626 m), weight 170 lb (77.111 kg), SpO2 100 %. Physical Exam  Constitutional: He appears well-developed and well-nourished.  HENT:  Mouth/Throat: Oropharynx is clear and moist. No oropharyngeal exudate.  Eyes: Conjunctivae are normal. No scleral icterus.  Neck: No thyromegaly present.  Cardiovascular: Normal rate, regular rhythm and normal heart sounds.   No murmur heard. Respiratory: Effort normal and breath sounds normal.  GI: He exhibits no distension and no mass. There is no tenderness.  Musculoskeletal: He exhibits no edema.  Lymphadenopathy:    He has no cervical adenopathy.  Neurological: He is alert.  Skin: Skin is warm and dry.     Assessment/Plan Average risk screening colonoscopy.  Hildred Laser, MD 06/06/2016, 7:34 AM

## 2016-06-06 NOTE — Op Note (Signed)
[facility] Patient Name: Jake Becker Procedure Date: 06/06/2016 7:19 AM MRN: [id number] Date of Birth: 12-23-1935 Attending MD: Hildred Laser , MD CSN: [id number] Age: 80 Admit Type: Outpatient Procedure:                Colonoscopy Indications:              Screening for colorectal malignant neoplasm Providers:                Hildred Laser, MD, Otis Peak B. Sharon Seller, RN, Isabella Stalling, Technician Referring MD:             Halford Chessman, MD Medicines:                Meperidine 50 mg IV, Midazolam 4 mg IV Complications:            No immediate complications. Estimated Blood Loss:     Estimated blood loss: none. Procedure:                Pre-Anesthesia Assessment:                           - Prior to the procedure, a History and Physical                            was performed, and patient medications and                            allergies were reviewed. The patient's tolerance of                            previous anesthesia was also reviewed. The risks                            and benefits of the procedure and the sedation                            options and risks were discussed with the patient.                            All questions were answered, and informed consent                            was obtained. Prior Anticoagulants: The patient                            last took previous NSAID medication 2 days prior to                            the procedure. ASA Grade Assessment: II - A patient                            with mild systemic disease. After reviewing the  risks and benefits, the patient was deemed in                            satisfactory condition to undergo the procedure.                           After obtaining informed consent, the colonoscope                            was passed under direct vision. Throughout the                            procedure, the patient's blood pressure, pulse,  and                            oxygen saturations were monitored continuously. The                            was introduced through the anus and advanced to the                            the cecum, identified by appendiceal orifice and                            ileocecal valve. The colonoscopy was performed                            without difficulty. The patient tolerated the                            procedure well. The quality of the bowel                            preparation was adequate. The ileocecal valve,                            appendiceal orifice, and rectum were photographed. Scope In: 7:45:26 AM Scope Out: 8:04:55 AM Scope Withdrawal Time: 0 hours 9 minutes 40 seconds  Total Procedure Duration: 0 hours 19 minutes 29 seconds  Findings:      Multiple small and large-mouthed diverticula were found in the entire       colon.      External and internal hemorrhoids were found during retroflexion. The       hemorrhoids were small. Impression:               - Diverticulosis in the entire examined colon. Most                            of the diverticula are located at sigmoid colon.                           - External and internal hemorrhoids.                           -  No specimens collected. Moderate Sedation:      Moderate (conscious) sedation was administered by the endoscopy nurse       and supervised by the endoscopist. The following parameters were       monitored: oxygen saturation, heart rate, blood pressure, CO2       capnography and response to care. Total physician intraservice time was       25 minutes. Recommendation:           - Patient has a contact number available for                            emergencies. The signs and symptoms of potential                            delayed complications were discussed with the                            patient. Return to normal activities tomorrow.                            Written discharge instructions were  provided to the                            patient.                           - High fiber diet today.                           - Continue present medications.                           - No repeat screening colonoscopy due to age. Procedure Code(s):        --- Professional ---                           [phone], Colonoscopy, flexible; diagnostic, including                            collection of specimen(s) by brushing or washing,                            when performed (separate procedure)                           99152, Moderate sedation services provided by the                            same physician or other qualified health care                            professional performing the diagnostic or                            therapeutic service that the sedation supports,  requiring the presence of an independent trained                            observer to assist in the monitoring of the                            patient's level of consciousness and physiological                            status; initial 15 minutes of intraservice time,                            patient age 6 years or older                           [phone], Moderate sedation services; each additional                            15 minutes intraservice time Diagnosis Code(s):        --- Professional ---                           Z12.11, Encounter for screening for malignant                            neoplasm of colon                           K64.8, Other hemorrhoids                           K57.30, Diverticulosis of large intestine without                            perforation or abscess without bleeding CPT copyright 2016 American Medical Association. All rights reserved. The codes documented in this report are preliminary and upon coder review may  be revised to meet current compliance requirements. Hildred Laser, MD Hildred Laser, MD 06/06/2016 8:13:33 AM This report has been  signed electronically. Number of Addenda: 0

## 2016-06-07 ENCOUNTER — Encounter (HOSPITAL_COMMUNITY): Payer: Self-pay | Admitting: Internal Medicine

## 2016-07-15 DIAGNOSIS — I10 Essential (primary) hypertension: Secondary | ICD-10-CM | POA: Diagnosis not present

## 2016-07-15 DIAGNOSIS — Z6829 Body mass index (BMI) 29.0-29.9, adult: Secondary | ICD-10-CM | POA: Diagnosis not present

## 2016-07-15 DIAGNOSIS — E782 Mixed hyperlipidemia: Secondary | ICD-10-CM | POA: Diagnosis not present

## 2016-07-15 DIAGNOSIS — E119 Type 2 diabetes mellitus without complications: Secondary | ICD-10-CM | POA: Diagnosis not present

## 2016-07-15 DIAGNOSIS — Z1389 Encounter for screening for other disorder: Secondary | ICD-10-CM | POA: Diagnosis not present

## 2016-07-16 DIAGNOSIS — E119 Type 2 diabetes mellitus without complications: Secondary | ICD-10-CM | POA: Diagnosis not present

## 2016-07-16 DIAGNOSIS — Z1389 Encounter for screening for other disorder: Secondary | ICD-10-CM | POA: Diagnosis not present

## 2016-07-16 DIAGNOSIS — Z6829 Body mass index (BMI) 29.0-29.9, adult: Secondary | ICD-10-CM | POA: Diagnosis not present

## 2016-07-16 DIAGNOSIS — E663 Overweight: Secondary | ICD-10-CM | POA: Diagnosis not present

## 2016-07-16 DIAGNOSIS — Z125 Encounter for screening for malignant neoplasm of prostate: Secondary | ICD-10-CM | POA: Diagnosis not present

## 2016-09-25 DIAGNOSIS — E119 Type 2 diabetes mellitus without complications: Secondary | ICD-10-CM | POA: Diagnosis not present

## 2016-09-25 DIAGNOSIS — H31001 Unspecified chorioretinal scars, right eye: Secondary | ICD-10-CM | POA: Diagnosis not present

## 2016-09-25 DIAGNOSIS — Z0100 Encounter for examination of eyes and vision without abnormal findings: Secondary | ICD-10-CM | POA: Diagnosis not present

## 2016-09-25 DIAGNOSIS — H04123 Dry eye syndrome of bilateral lacrimal glands: Secondary | ICD-10-CM | POA: Diagnosis not present

## 2016-10-25 DIAGNOSIS — E119 Type 2 diabetes mellitus without complications: Secondary | ICD-10-CM | POA: Diagnosis not present

## 2016-10-25 DIAGNOSIS — E782 Mixed hyperlipidemia: Secondary | ICD-10-CM | POA: Diagnosis not present

## 2016-10-25 DIAGNOSIS — E6609 Other obesity due to excess calories: Secondary | ICD-10-CM | POA: Diagnosis not present

## 2016-10-25 DIAGNOSIS — Z23 Encounter for immunization: Secondary | ICD-10-CM | POA: Diagnosis not present

## 2016-10-25 DIAGNOSIS — E039 Hypothyroidism, unspecified: Secondary | ICD-10-CM | POA: Diagnosis not present

## 2016-10-25 DIAGNOSIS — Z1389 Encounter for screening for other disorder: Secondary | ICD-10-CM | POA: Diagnosis not present

## 2016-10-25 DIAGNOSIS — I10 Essential (primary) hypertension: Secondary | ICD-10-CM | POA: Diagnosis not present

## 2016-10-25 DIAGNOSIS — Z6830 Body mass index (BMI) 30.0-30.9, adult: Secondary | ICD-10-CM | POA: Diagnosis not present

## 2016-12-09 DIAGNOSIS — E6609 Other obesity due to excess calories: Secondary | ICD-10-CM | POA: Diagnosis not present

## 2016-12-09 DIAGNOSIS — Z1389 Encounter for screening for other disorder: Secondary | ICD-10-CM | POA: Diagnosis not present

## 2016-12-09 DIAGNOSIS — Z6830 Body mass index (BMI) 30.0-30.9, adult: Secondary | ICD-10-CM | POA: Diagnosis not present

## 2016-12-09 DIAGNOSIS — J209 Acute bronchitis, unspecified: Secondary | ICD-10-CM | POA: Diagnosis not present

## 2016-12-09 DIAGNOSIS — J069 Acute upper respiratory infection, unspecified: Secondary | ICD-10-CM | POA: Diagnosis not present

## 2017-02-10 DIAGNOSIS — H348312 Tributary (branch) retinal vein occlusion, right eye, stable: Secondary | ICD-10-CM | POA: Diagnosis not present

## 2017-02-10 DIAGNOSIS — H3521 Other non-diabetic proliferative retinopathy, right eye: Secondary | ICD-10-CM | POA: Diagnosis not present

## 2017-02-10 DIAGNOSIS — H43392 Other vitreous opacities, left eye: Secondary | ICD-10-CM | POA: Diagnosis not present

## 2017-02-10 DIAGNOSIS — E119 Type 2 diabetes mellitus without complications: Secondary | ICD-10-CM | POA: Diagnosis not present

## 2017-02-13 DIAGNOSIS — D225 Melanocytic nevi of trunk: Secondary | ICD-10-CM | POA: Diagnosis not present

## 2017-02-13 DIAGNOSIS — L570 Actinic keratosis: Secondary | ICD-10-CM | POA: Diagnosis not present

## 2017-02-13 DIAGNOSIS — X32XXXD Exposure to sunlight, subsequent encounter: Secondary | ICD-10-CM | POA: Diagnosis not present

## 2017-02-13 DIAGNOSIS — L820 Inflamed seborrheic keratosis: Secondary | ICD-10-CM | POA: Diagnosis not present

## 2017-03-04 DIAGNOSIS — I10 Essential (primary) hypertension: Secondary | ICD-10-CM | POA: Diagnosis not present

## 2017-03-04 DIAGNOSIS — Z6828 Body mass index (BMI) 28.0-28.9, adult: Secondary | ICD-10-CM | POA: Diagnosis not present

## 2017-03-04 DIAGNOSIS — E119 Type 2 diabetes mellitus without complications: Secondary | ICD-10-CM | POA: Diagnosis not present

## 2017-03-04 DIAGNOSIS — E039 Hypothyroidism, unspecified: Secondary | ICD-10-CM | POA: Diagnosis not present

## 2017-03-04 DIAGNOSIS — E782 Mixed hyperlipidemia: Secondary | ICD-10-CM | POA: Diagnosis not present

## 2017-03-04 DIAGNOSIS — E663 Overweight: Secondary | ICD-10-CM | POA: Diagnosis not present

## 2017-04-16 DIAGNOSIS — Z1389 Encounter for screening for other disorder: Secondary | ICD-10-CM | POA: Diagnosis not present

## 2017-04-16 DIAGNOSIS — H6123 Impacted cerumen, bilateral: Secondary | ICD-10-CM | POA: Diagnosis not present

## 2017-04-16 DIAGNOSIS — E119 Type 2 diabetes mellitus without complications: Secondary | ICD-10-CM | POA: Diagnosis not present

## 2017-04-16 DIAGNOSIS — Z6828 Body mass index (BMI) 28.0-28.9, adult: Secondary | ICD-10-CM | POA: Diagnosis not present

## 2017-04-16 DIAGNOSIS — R42 Dizziness and giddiness: Secondary | ICD-10-CM | POA: Diagnosis not present

## 2017-04-16 DIAGNOSIS — E663 Overweight: Secondary | ICD-10-CM | POA: Diagnosis not present

## 2017-04-16 DIAGNOSIS — I10 Essential (primary) hypertension: Secondary | ICD-10-CM | POA: Diagnosis not present

## 2017-06-09 DIAGNOSIS — E782 Mixed hyperlipidemia: Secondary | ICD-10-CM | POA: Diagnosis not present

## 2017-06-09 DIAGNOSIS — E663 Overweight: Secondary | ICD-10-CM | POA: Diagnosis not present

## 2017-06-09 DIAGNOSIS — E119 Type 2 diabetes mellitus without complications: Secondary | ICD-10-CM | POA: Diagnosis not present

## 2017-06-09 DIAGNOSIS — Z125 Encounter for screening for malignant neoplasm of prostate: Secondary | ICD-10-CM | POA: Diagnosis not present

## 2017-06-09 DIAGNOSIS — Z6829 Body mass index (BMI) 29.0-29.9, adult: Secondary | ICD-10-CM | POA: Diagnosis not present

## 2017-06-09 DIAGNOSIS — I10 Essential (primary) hypertension: Secondary | ICD-10-CM | POA: Diagnosis not present

## 2017-06-09 DIAGNOSIS — E039 Hypothyroidism, unspecified: Secondary | ICD-10-CM | POA: Diagnosis not present

## 2017-06-09 DIAGNOSIS — R3912 Poor urinary stream: Secondary | ICD-10-CM | POA: Diagnosis not present

## 2017-06-24 DIAGNOSIS — R3912 Poor urinary stream: Secondary | ICD-10-CM | POA: Diagnosis not present

## 2017-09-25 DIAGNOSIS — D23122 Other benign neoplasm of skin of left lower eyelid, including canthus: Secondary | ICD-10-CM | POA: Diagnosis not present

## 2017-09-25 DIAGNOSIS — H52203 Unspecified astigmatism, bilateral: Secondary | ICD-10-CM | POA: Diagnosis not present

## 2017-09-25 DIAGNOSIS — H349 Unspecified retinal vascular occlusion: Secondary | ICD-10-CM | POA: Diagnosis not present

## 2017-09-25 DIAGNOSIS — E119 Type 2 diabetes mellitus without complications: Secondary | ICD-10-CM | POA: Diagnosis not present

## 2017-10-13 DIAGNOSIS — E039 Hypothyroidism, unspecified: Secondary | ICD-10-CM | POA: Diagnosis not present

## 2017-10-13 DIAGNOSIS — Z23 Encounter for immunization: Secondary | ICD-10-CM | POA: Diagnosis not present

## 2017-10-13 DIAGNOSIS — Z6830 Body mass index (BMI) 30.0-30.9, adult: Secondary | ICD-10-CM | POA: Diagnosis not present

## 2017-10-13 DIAGNOSIS — E119 Type 2 diabetes mellitus without complications: Secondary | ICD-10-CM | POA: Diagnosis not present

## 2017-10-13 DIAGNOSIS — I10 Essential (primary) hypertension: Secondary | ICD-10-CM | POA: Diagnosis not present

## 2017-10-13 DIAGNOSIS — E782 Mixed hyperlipidemia: Secondary | ICD-10-CM | POA: Diagnosis not present

## 2017-10-13 DIAGNOSIS — Z1389 Encounter for screening for other disorder: Secondary | ICD-10-CM | POA: Diagnosis not present

## 2017-11-05 ENCOUNTER — Other Ambulatory Visit: Payer: Self-pay | Admitting: Ophthalmology

## 2017-11-05 DIAGNOSIS — D485 Neoplasm of uncertain behavior of skin: Secondary | ICD-10-CM | POA: Diagnosis not present

## 2017-11-05 DIAGNOSIS — L820 Inflamed seborrheic keratosis: Secondary | ICD-10-CM | POA: Diagnosis not present

## 2017-11-05 DIAGNOSIS — D23121 Other benign neoplasm of skin of left upper eyelid, including canthus: Secondary | ICD-10-CM | POA: Diagnosis not present

## 2018-02-09 DIAGNOSIS — H3521 Other non-diabetic proliferative retinopathy, right eye: Secondary | ICD-10-CM | POA: Diagnosis not present

## 2018-02-09 DIAGNOSIS — E119 Type 2 diabetes mellitus without complications: Secondary | ICD-10-CM | POA: Diagnosis not present

## 2018-02-09 DIAGNOSIS — H348312 Tributary (branch) retinal vein occlusion, right eye, stable: Secondary | ICD-10-CM | POA: Diagnosis not present

## 2018-02-09 DIAGNOSIS — H43392 Other vitreous opacities, left eye: Secondary | ICD-10-CM | POA: Diagnosis not present

## 2018-02-16 DIAGNOSIS — E8881 Metabolic syndrome: Secondary | ICD-10-CM | POA: Diagnosis not present

## 2018-02-16 DIAGNOSIS — Z6830 Body mass index (BMI) 30.0-30.9, adult: Secondary | ICD-10-CM | POA: Diagnosis not present

## 2018-02-16 DIAGNOSIS — C44519 Basal cell carcinoma of skin of other part of trunk: Secondary | ICD-10-CM | POA: Diagnosis not present

## 2018-02-16 DIAGNOSIS — X32XXXD Exposure to sunlight, subsequent encounter: Secondary | ICD-10-CM | POA: Diagnosis not present

## 2018-02-16 DIAGNOSIS — E119 Type 2 diabetes mellitus without complications: Secondary | ICD-10-CM | POA: Diagnosis not present

## 2018-02-16 DIAGNOSIS — Z1283 Encounter for screening for malignant neoplasm of skin: Secondary | ICD-10-CM | POA: Diagnosis not present

## 2018-02-16 DIAGNOSIS — I10 Essential (primary) hypertension: Secondary | ICD-10-CM | POA: Diagnosis not present

## 2018-02-16 DIAGNOSIS — E782 Mixed hyperlipidemia: Secondary | ICD-10-CM | POA: Diagnosis not present

## 2018-02-16 DIAGNOSIS — E039 Hypothyroidism, unspecified: Secondary | ICD-10-CM | POA: Diagnosis not present

## 2018-02-16 DIAGNOSIS — E6609 Other obesity due to excess calories: Secondary | ICD-10-CM | POA: Diagnosis not present

## 2018-02-16 DIAGNOSIS — H6121 Impacted cerumen, right ear: Secondary | ICD-10-CM | POA: Diagnosis not present

## 2018-02-16 DIAGNOSIS — Z1389 Encounter for screening for other disorder: Secondary | ICD-10-CM | POA: Diagnosis not present

## 2018-02-16 DIAGNOSIS — L570 Actinic keratosis: Secondary | ICD-10-CM | POA: Diagnosis not present

## 2018-02-26 DIAGNOSIS — E6609 Other obesity due to excess calories: Secondary | ICD-10-CM | POA: Diagnosis not present

## 2018-02-26 DIAGNOSIS — Z6830 Body mass index (BMI) 30.0-30.9, adult: Secondary | ICD-10-CM | POA: Diagnosis not present

## 2018-02-26 DIAGNOSIS — J069 Acute upper respiratory infection, unspecified: Secondary | ICD-10-CM | POA: Diagnosis not present

## 2018-03-02 DIAGNOSIS — E119 Type 2 diabetes mellitus without complications: Secondary | ICD-10-CM | POA: Diagnosis not present

## 2018-03-19 DIAGNOSIS — X32XXXD Exposure to sunlight, subsequent encounter: Secondary | ICD-10-CM | POA: Diagnosis not present

## 2018-03-19 DIAGNOSIS — L570 Actinic keratosis: Secondary | ICD-10-CM | POA: Diagnosis not present

## 2018-06-05 DIAGNOSIS — Z6829 Body mass index (BMI) 29.0-29.9, adult: Secondary | ICD-10-CM | POA: Diagnosis not present

## 2018-06-05 DIAGNOSIS — E119 Type 2 diabetes mellitus without complications: Secondary | ICD-10-CM | POA: Diagnosis not present

## 2018-06-05 DIAGNOSIS — E039 Hypothyroidism, unspecified: Secondary | ICD-10-CM | POA: Diagnosis not present

## 2018-06-05 DIAGNOSIS — N183 Chronic kidney disease, stage 3 (moderate): Secondary | ICD-10-CM | POA: Diagnosis not present

## 2018-06-05 DIAGNOSIS — Z1389 Encounter for screening for other disorder: Secondary | ICD-10-CM | POA: Diagnosis not present

## 2018-06-05 DIAGNOSIS — Z0001 Encounter for general adult medical examination with abnormal findings: Secondary | ICD-10-CM | POA: Diagnosis not present

## 2018-06-05 DIAGNOSIS — I10 Essential (primary) hypertension: Secondary | ICD-10-CM | POA: Diagnosis not present

## 2018-06-05 DIAGNOSIS — E663 Overweight: Secondary | ICD-10-CM | POA: Diagnosis not present

## 2018-09-07 DIAGNOSIS — Z23 Encounter for immunization: Secondary | ICD-10-CM | POA: Diagnosis not present

## 2018-09-07 DIAGNOSIS — B009 Herpesviral infection, unspecified: Secondary | ICD-10-CM | POA: Diagnosis not present

## 2018-09-07 DIAGNOSIS — E6609 Other obesity due to excess calories: Secondary | ICD-10-CM | POA: Diagnosis not present

## 2018-09-07 DIAGNOSIS — Z6830 Body mass index (BMI) 30.0-30.9, adult: Secondary | ICD-10-CM | POA: Diagnosis not present

## 2018-10-01 DIAGNOSIS — H3500 Unspecified background retinopathy: Secondary | ICD-10-CM | POA: Diagnosis not present

## 2018-10-01 DIAGNOSIS — H532 Diplopia: Secondary | ICD-10-CM | POA: Diagnosis not present

## 2018-10-01 DIAGNOSIS — H52203 Unspecified astigmatism, bilateral: Secondary | ICD-10-CM | POA: Diagnosis not present

## 2018-10-01 DIAGNOSIS — Z961 Presence of intraocular lens: Secondary | ICD-10-CM | POA: Diagnosis not present

## 2018-10-29 DIAGNOSIS — H532 Diplopia: Secondary | ICD-10-CM | POA: Diagnosis not present

## 2018-10-29 DIAGNOSIS — Z681 Body mass index (BMI) 19 or less, adult: Secondary | ICD-10-CM | POA: Diagnosis not present

## 2018-11-03 ENCOUNTER — Other Ambulatory Visit (HOSPITAL_COMMUNITY): Payer: Self-pay | Admitting: Family Medicine

## 2018-11-03 DIAGNOSIS — H532 Diplopia: Secondary | ICD-10-CM

## 2018-11-04 DIAGNOSIS — H532 Diplopia: Secondary | ICD-10-CM | POA: Diagnosis not present

## 2018-11-04 DIAGNOSIS — Z681 Body mass index (BMI) 19 or less, adult: Secondary | ICD-10-CM | POA: Diagnosis not present

## 2018-11-13 ENCOUNTER — Ambulatory Visit (HOSPITAL_COMMUNITY)
Admission: RE | Admit: 2018-11-13 | Discharge: 2018-11-13 | Disposition: A | Discharge status: Home / Self Care | Payer: Medicare Other | Source: Ambulatory Visit | Attending: Family Medicine | Admitting: Family Medicine

## 2018-11-13 ENCOUNTER — Encounter (HOSPITAL_COMMUNITY): Payer: Self-pay

## 2018-11-13 DIAGNOSIS — H532 Diplopia: Secondary | ICD-10-CM | POA: Insufficient documentation

## 2018-11-16 ENCOUNTER — Other Ambulatory Visit (HOSPITAL_COMMUNITY): Payer: Self-pay | Admitting: Family Medicine

## 2018-11-16 ENCOUNTER — Other Ambulatory Visit: Payer: Self-pay | Admitting: Family Medicine

## 2018-11-17 ENCOUNTER — Other Ambulatory Visit (HOSPITAL_COMMUNITY): Payer: Self-pay | Admitting: Family Medicine

## 2018-11-17 ENCOUNTER — Other Ambulatory Visit: Payer: Self-pay | Admitting: Family Medicine

## 2018-11-17 DIAGNOSIS — R51 Headache: Principal | ICD-10-CM

## 2018-11-17 DIAGNOSIS — H532 Diplopia: Secondary | ICD-10-CM

## 2018-11-17 DIAGNOSIS — R519 Headache, unspecified: Secondary | ICD-10-CM

## 2018-11-20 DIAGNOSIS — I63219 Cerebral infarction due to unspecified occlusion or stenosis of unspecified vertebral arteries: Secondary | ICD-10-CM | POA: Diagnosis not present

## 2018-11-20 DIAGNOSIS — Z681 Body mass index (BMI) 19 or less, adult: Secondary | ICD-10-CM | POA: Diagnosis not present

## 2018-11-20 DIAGNOSIS — H532 Diplopia: Secondary | ICD-10-CM | POA: Diagnosis not present

## 2018-11-25 ENCOUNTER — Ambulatory Visit (HOSPITAL_COMMUNITY)
Admission: RE | Admit: 2018-11-25 | Discharge: 2018-11-25 | Disposition: A | Discharge status: Home / Self Care | Payer: Medicare Other | Source: Ambulatory Visit | Attending: Family Medicine | Admitting: Family Medicine

## 2018-11-25 DIAGNOSIS — I6523 Occlusion and stenosis of bilateral carotid arteries: Secondary | ICD-10-CM | POA: Diagnosis not present

## 2018-11-25 DIAGNOSIS — H532 Diplopia: Secondary | ICD-10-CM | POA: Diagnosis not present

## 2019-03-23 DIAGNOSIS — H348312 Tributary (branch) retinal vein occlusion, right eye, stable: Secondary | ICD-10-CM | POA: Diagnosis not present

## 2019-03-23 DIAGNOSIS — H43392 Other vitreous opacities, left eye: Secondary | ICD-10-CM | POA: Diagnosis not present

## 2019-03-23 DIAGNOSIS — E119 Type 2 diabetes mellitus without complications: Secondary | ICD-10-CM | POA: Diagnosis not present

## 2019-03-23 DIAGNOSIS — H3521 Other non-diabetic proliferative retinopathy, right eye: Secondary | ICD-10-CM | POA: Diagnosis not present

## 2019-05-27 DIAGNOSIS — E119 Type 2 diabetes mellitus without complications: Secondary | ICD-10-CM | POA: Diagnosis not present

## 2019-05-27 DIAGNOSIS — E039 Hypothyroidism, unspecified: Secondary | ICD-10-CM | POA: Diagnosis not present

## 2019-05-27 DIAGNOSIS — I10 Essential (primary) hypertension: Secondary | ICD-10-CM | POA: Diagnosis not present

## 2019-05-27 DIAGNOSIS — Z6829 Body mass index (BMI) 29.0-29.9, adult: Secondary | ICD-10-CM | POA: Diagnosis not present

## 2019-05-27 DIAGNOSIS — Z1389 Encounter for screening for other disorder: Secondary | ICD-10-CM | POA: Diagnosis not present

## 2019-05-27 DIAGNOSIS — R972 Elevated prostate specific antigen [PSA]: Secondary | ICD-10-CM | POA: Diagnosis not present

## 2019-05-27 DIAGNOSIS — E7849 Other hyperlipidemia: Secondary | ICD-10-CM | POA: Diagnosis not present

## 2019-05-27 DIAGNOSIS — E663 Overweight: Secondary | ICD-10-CM | POA: Diagnosis not present

## 2019-06-16 DIAGNOSIS — Z6829 Body mass index (BMI) 29.0-29.9, adult: Secondary | ICD-10-CM | POA: Diagnosis not present

## 2019-06-16 DIAGNOSIS — E663 Overweight: Secondary | ICD-10-CM | POA: Diagnosis not present

## 2019-06-16 DIAGNOSIS — Z0000 Encounter for general adult medical examination without abnormal findings: Secondary | ICD-10-CM | POA: Diagnosis not present

## 2019-06-16 DIAGNOSIS — E119 Type 2 diabetes mellitus without complications: Secondary | ICD-10-CM | POA: Diagnosis not present

## 2019-06-16 DIAGNOSIS — Z1389 Encounter for screening for other disorder: Secondary | ICD-10-CM | POA: Diagnosis not present

## 2019-09-06 DIAGNOSIS — Z23 Encounter for immunization: Secondary | ICD-10-CM | POA: Diagnosis not present

## 2019-10-11 DIAGNOSIS — E113292 Type 2 diabetes mellitus with mild nonproliferative diabetic retinopathy without macular edema, left eye: Secondary | ICD-10-CM | POA: Diagnosis not present

## 2019-10-11 DIAGNOSIS — H3500 Unspecified background retinopathy: Secondary | ICD-10-CM | POA: Diagnosis not present

## 2019-10-11 DIAGNOSIS — H04123 Dry eye syndrome of bilateral lacrimal glands: Secondary | ICD-10-CM | POA: Diagnosis not present

## 2019-10-11 DIAGNOSIS — H52203 Unspecified astigmatism, bilateral: Secondary | ICD-10-CM | POA: Diagnosis not present

## 2019-11-18 DIAGNOSIS — E1122 Type 2 diabetes mellitus with diabetic chronic kidney disease: Secondary | ICD-10-CM | POA: Diagnosis not present

## 2019-11-18 DIAGNOSIS — I129 Hypertensive chronic kidney disease with stage 1 through stage 4 chronic kidney disease, or unspecified chronic kidney disease: Secondary | ICD-10-CM | POA: Diagnosis not present

## 2019-11-18 DIAGNOSIS — Z7984 Long term (current) use of oral hypoglycemic drugs: Secondary | ICD-10-CM | POA: Diagnosis not present

## 2019-11-18 DIAGNOSIS — N182 Chronic kidney disease, stage 2 (mild): Secondary | ICD-10-CM | POA: Diagnosis not present

## 2019-12-09 ENCOUNTER — Ambulatory Visit: Payer: Medicare Other | Attending: Internal Medicine

## 2019-12-09 DIAGNOSIS — Z23 Encounter for immunization: Secondary | ICD-10-CM | POA: Insufficient documentation

## 2019-12-09 NOTE — Progress Notes (Signed)
   Covid-19 Vaccination Clinic  Name:  Jake Becker    MRN: [id number] DOB: Jan 16, 1936  12/09/2019  Mr. Jake Becker was observed post Covid-19 immunization for 15 minutes without incidence. He was provided with Vaccine Information Sheet and instruction to access the V-Safe system.   Mr. Jake Becker was instructed to call 911 with any severe reactions post vaccine: Marland Kitchen Difficulty breathing  . Swelling of your face and throat  . A fast heartbeat  . A bad rash all over your body  . Dizziness and weakness    Immunizations Administered    Name Date Dose VIS Date Route   Pfizer COVID-19 Vaccine 12/09/2019  5:15 PM 0.3 mL 10/29/2019 Intramuscular   Manufacturer: [place]   Lot: [id number]   [place]: [id number]

## 2019-12-16 ENCOUNTER — Ambulatory Visit: Payer: Self-pay

## 2019-12-30 ENCOUNTER — Ambulatory Visit: Payer: Medicare Other | Attending: Internal Medicine

## 2019-12-30 DIAGNOSIS — Z23 Encounter for immunization: Secondary | ICD-10-CM | POA: Insufficient documentation

## 2019-12-30 NOTE — Progress Notes (Signed)
   Covid-19 Vaccination Clinic  Name:  Jake Becker    MRN: [id number] DOB: [birth year]  12/30/2019  [name] was observed post Covid-19 immunization for 15 minutes without incidence. He was provided with Vaccine Information Sheet and instruction to access the V-Safe system.   [name] was instructed to call 911 with any severe reactions post vaccine: Marland Kitchen Difficulty breathing  . Swelling of your face and throat  . A fast heartbeat  . A bad rash all over your body  . Dizziness and weakness    Immunizations Administered    Name Date Dose VIS Date Route   Pfizer COVID-19 Vaccine 12/30/2019  8:30 AM 0.3 mL 10/29/2019 Intramuscular   Manufacturer: [place]   Lot: [id number]   [place]: [id number]

## 2020-01-06 ENCOUNTER — Ambulatory Visit: Payer: Self-pay

## 2020-01-13 ENCOUNTER — Ambulatory Visit (HOSPITAL_COMMUNITY): Payer: Medicare Other

## 2020-01-13 ENCOUNTER — Other Ambulatory Visit: Payer: Self-pay | Admitting: Preventative Medicine

## 2020-01-13 DIAGNOSIS — R350 Frequency of micturition: Secondary | ICD-10-CM | POA: Diagnosis not present

## 2020-01-13 DIAGNOSIS — R42 Dizziness and giddiness: Secondary | ICD-10-CM

## 2020-01-13 DIAGNOSIS — E119 Type 2 diabetes mellitus without complications: Secondary | ICD-10-CM | POA: Diagnosis not present

## 2020-01-13 DIAGNOSIS — R110 Nausea: Secondary | ICD-10-CM | POA: Diagnosis not present

## 2020-01-16 DIAGNOSIS — E1122 Type 2 diabetes mellitus with diabetic chronic kidney disease: Secondary | ICD-10-CM | POA: Diagnosis not present

## 2020-01-16 DIAGNOSIS — I129 Hypertensive chronic kidney disease with stage 1 through stage 4 chronic kidney disease, or unspecified chronic kidney disease: Secondary | ICD-10-CM | POA: Diagnosis not present

## 2020-01-16 DIAGNOSIS — N182 Chronic kidney disease, stage 2 (mild): Secondary | ICD-10-CM | POA: Diagnosis not present

## 2020-01-16 DIAGNOSIS — E8881 Metabolic syndrome: Secondary | ICD-10-CM | POA: Diagnosis not present

## 2020-02-16 DIAGNOSIS — I129 Hypertensive chronic kidney disease with stage 1 through stage 4 chronic kidney disease, or unspecified chronic kidney disease: Secondary | ICD-10-CM | POA: Diagnosis not present

## 2020-02-16 DIAGNOSIS — N182 Chronic kidney disease, stage 2 (mild): Secondary | ICD-10-CM | POA: Diagnosis not present

## 2020-02-16 DIAGNOSIS — E1122 Type 2 diabetes mellitus with diabetic chronic kidney disease: Secondary | ICD-10-CM | POA: Diagnosis not present

## 2020-03-14 DIAGNOSIS — E039 Hypothyroidism, unspecified: Secondary | ICD-10-CM | POA: Diagnosis not present

## 2020-03-14 DIAGNOSIS — I10 Essential (primary) hypertension: Secondary | ICD-10-CM | POA: Diagnosis not present

## 2020-03-14 DIAGNOSIS — Z6829 Body mass index (BMI) 29.0-29.9, adult: Secondary | ICD-10-CM | POA: Diagnosis not present

## 2020-03-14 DIAGNOSIS — E663 Overweight: Secondary | ICD-10-CM | POA: Diagnosis not present

## 2020-03-14 DIAGNOSIS — N183 Chronic kidney disease, stage 3 unspecified: Secondary | ICD-10-CM | POA: Diagnosis not present

## 2020-03-14 DIAGNOSIS — E119 Type 2 diabetes mellitus without complications: Secondary | ICD-10-CM | POA: Diagnosis not present

## 2020-03-14 DIAGNOSIS — E7849 Other hyperlipidemia: Secondary | ICD-10-CM | POA: Diagnosis not present

## 2020-03-17 DIAGNOSIS — I129 Hypertensive chronic kidney disease with stage 1 through stage 4 chronic kidney disease, or unspecified chronic kidney disease: Secondary | ICD-10-CM | POA: Diagnosis not present

## 2020-03-17 DIAGNOSIS — N182 Chronic kidney disease, stage 2 (mild): Secondary | ICD-10-CM | POA: Diagnosis not present

## 2020-03-17 DIAGNOSIS — E1122 Type 2 diabetes mellitus with diabetic chronic kidney disease: Secondary | ICD-10-CM | POA: Diagnosis not present

## 2020-03-17 DIAGNOSIS — E8881 Metabolic syndrome: Secondary | ICD-10-CM | POA: Diagnosis not present

## 2020-03-20 DIAGNOSIS — H43392 Other vitreous opacities, left eye: Secondary | ICD-10-CM | POA: Insufficient documentation

## 2020-03-20 DIAGNOSIS — H3521 Other non-diabetic proliferative retinopathy, right eye: Secondary | ICD-10-CM | POA: Insufficient documentation

## 2020-03-20 DIAGNOSIS — E119 Type 2 diabetes mellitus without complications: Secondary | ICD-10-CM | POA: Insufficient documentation

## 2020-03-20 DIAGNOSIS — H31009 Unspecified chorioretinal scars, unspecified eye: Secondary | ICD-10-CM | POA: Insufficient documentation

## 2020-03-20 DIAGNOSIS — H348312 Tributary (branch) retinal vein occlusion, right eye, stable: Secondary | ICD-10-CM | POA: Insufficient documentation

## 2020-03-21 ENCOUNTER — Encounter (INDEPENDENT_AMBULATORY_CARE_PROVIDER_SITE_OTHER): Payer: Self-pay | Admitting: Ophthalmology

## 2020-03-21 ENCOUNTER — Ambulatory Visit (INDEPENDENT_AMBULATORY_CARE_PROVIDER_SITE_OTHER): Payer: Medicare Other | Admitting: Ophthalmology

## 2020-03-21 ENCOUNTER — Other Ambulatory Visit: Payer: Self-pay

## 2020-03-21 DIAGNOSIS — E119 Type 2 diabetes mellitus without complications: Secondary | ICD-10-CM | POA: Diagnosis not present

## 2020-03-21 DIAGNOSIS — H31001 Unspecified chorioretinal scars, right eye: Secondary | ICD-10-CM | POA: Diagnosis not present

## 2020-03-21 DIAGNOSIS — H3521 Other non-diabetic proliferative retinopathy, right eye: Secondary | ICD-10-CM | POA: Diagnosis not present

## 2020-03-21 DIAGNOSIS — H43812 Vitreous degeneration, left eye: Secondary | ICD-10-CM | POA: Insufficient documentation

## 2020-03-21 DIAGNOSIS — H43392 Other vitreous opacities, left eye: Secondary | ICD-10-CM | POA: Diagnosis not present

## 2020-03-21 DIAGNOSIS — H348312 Tributary (branch) retinal vein occlusion, right eye, stable: Secondary | ICD-10-CM | POA: Diagnosis not present

## 2020-03-21 NOTE — Assessment & Plan Note (Signed)

## 2020-03-21 NOTE — Progress Notes (Signed)
03/21/2020     CHIEF COMPLAINT Patient presents for Retina Follow Up   HISTORY OF PRESENT ILLNESS: Jake Becker is a 84 y.o. male who presents to the clinic today for:   HPI    Retina Follow Up    Patient presents with  Diabetic Retinopathy.  In both eyes.  This started 1 year ago.  Severity is mild.  Duration of 1 year.  Since onset it is stable.          Comments    Pt denies noticeable changes to [place] OU since last visit. Pt denies ocular pain, flashes of light, or floaters OU.   LBS: pt does not check  A1c: 6.4, 03/13/2020       Last edited by Jake Becker, [place] on 03/21/2020  8:09 AM. (History)      Referring physician: Sharilyn Sites, MD [address] [place],  [place] [address]  HISTORICAL INFORMATION:   Selected notes from the MEDICAL RECORD NUMBER       CURRENT MEDICATIONS: Current Outpatient Medications (Ophthalmic Drugs)  Medication Sig  . Polyethyl Glycol-Propyl Glycol (SYSTANE) 0.4-0.3 % SOLN Apply 1 drop to eye every evening.   No current facility-administered medications for this visit. (Ophthalmic Drugs)   Current Outpatient Medications (Other)  Medication Sig  . atorvastatin (LIPITOR) 20 MG tablet Take 10 mg by mouth daily after supper.   Marland Kitchen BENICAR 40 MG tablet Take 40 mg by mouth daily.  . Cinnamon 500 MG capsule Take 500 mg by mouth daily.  Marland Kitchen levothyroxine (SYNTHROID, LEVOTHROID) 50 MCG tablet Take 50 mcg by mouth daily before breakfast.  . meloxicam (MOBIC) 15 MG tablet Take 15 mg by mouth daily.  . metFORMIN (GLUCOPHAGE) 500 MG tablet Take 500 mg by mouth 2 (two) times daily.  . psyllium (REGULOID) 0.52 g capsule Take 0.52 g by mouth daily.   No current facility-administered medications for this visit. (Other)      REVIEW OF SYSTEMS:    ALLERGIES Allergies  Allergen Reactions  . Oxycodone Other (See Comments)    Pt.see things    PAST MEDICAL HISTORY Past Medical History:  Diagnosis Date  . Arthritis   . Diabetes  mellitus without complication ([place])   . Hypertension   . Hypothyroidism   . Thyroid disease    Past Surgical History:  Procedure Laterality Date  . COLONOSCOPY N/A 06/06/2016   Procedure: COLONOSCOPY;  Surgeon: Rogene Houston, MD;  [facility]: [facility];  Service: Endoscopy;  Laterality: N/A;  730  . EYE SURGERY     cataract    FAMILY HISTORY History reviewed. No pertinent family history.  SOCIAL HISTORY Social History   Tobacco Use  . Smoking status: Never Smoker  . Smokeless tobacco: Never Used  Substance Use Topics  . Alcohol use: No  . Drug use: No         OPHTHALMIC EXAM:  Base Eye Exam    Visual Acuity (ETDRS)      Right Left   Dist  20/25 20/20 -1       Tonometry (Tonopen, 8:13 AM)      Right Left   Pressure 15 14       Pupils      Pupils Dark Light Shape React APD   Right PERRL 4 3 Round Brisk None   Left PERRL 4 3 Round Brisk None       Visual Fields (Counting fingers)      Left Right    Full  Restrictions  Total inferior temporal deficiency       Extraocular Movement      Right Left    Full Full       Neuro/Psych    Oriented x3: Yes   Mood/Affect: Normal       Dilation    Both eyes: 1.0% Mydriacyl, 2.5% Phenylephrine @ 8:13 AM        Slit Lamp and Fundus Exam    External Exam      Right Left   External Normal Normal       Slit Lamp Exam      Right Left   Lids/Lashes Normal Normal   Conjunctiva/Sclera White and quiet White and quiet   Cornea Clear Clear   Anterior Chamber Deep and quiet Deep and quiet   Iris Round and reactive Round and reactive   Lens Posterior chamber intraocular lens Posterior chamber intraocular lens   Anterior Vitreous Normal Normal       Fundus Exam      Right Left   Posterior Vitreous Minor vitreal papillary traction Normal   Disc Collaterals Normal   C/D Ratio 0.2 0.2   Macula Microaneurysms, no hemorrhage, no macular thickening, Hard drusen Normal   Vessels Old macular branch retinal  vein occlusion superiorly, stable  NO DR   Periphery Normal Normal          IMAGING AND PROCEDURES  Imaging and Procedures for 03/21/20  OCT, Retina - OU - Both Eyes       Right Eye Quality was good. Scan locations included subfoveal. Central Foveal Thickness: 317. Progression has been stable. Findings include abnormal foveal contour.   Left Eye Quality was good. Scan locations included subfoveal. Central Foveal Thickness: 296. Progression has been stable. Findings include normal observations.   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  An informational brochure was given.  Treatment options including sector pan retinal photocoagulation were reviewed.  The patient's questions were answered.   OD this condition is now quiesced sent and stable  Diabetes mellitus without complication ([place]) The patient has diabetes without any evidence of retinopathy. The patient advised to maintain good blood glucose control, excellent blood pressure control, and favorable levels of cholesterol, low density lipoprotein, and high density lipoproteins. Follow up in 1 year was recommended. Explained that fluctuations in visual acuity , or "out of focus", may result from large variations of blood sugar control.      ICD-10-CM   1. 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 Other non-diabetic proliferative retinopathy, right eye  H35.21   3. Diabetes mellitus without complication ([place])  [id number]   4. Posterior vitreous detachment of left eye  H43.812 OCT, Retina - OU - Both Eyes  5. Other vitreous opacities, left eye  H43.392   6.  Chorioretinal scar of right eye  H31.001     1.  OD, with a history of retinal neovascularization related to branch retinal vein occlusion superotemporally.  This condition is now quiesced sent after previous sector PRP in the past.  There is no active maculopathy.  2.  No detectable diabetic retinopathy is noted OU.  3.  Ophthalmic Meds Ordered this visit:  No orders of the defined types were placed in this encounter.      Return in about 1 year (around 03/21/2021) for DILATE OU, OCT.  There are no Patient Instructions on file for this visit.   Explained the diagnoses, plan, and follow up with the patient and they expressed understanding.  Patient expressed understanding of the importance of proper follow up care.   Clent Demark Noheli Melder M.D. Diseases & Surgery of the Retina and Vitreous Retina & Diabetic [place] 03/21/20     Abbreviations: M myopia (nearsighted); A astigmatism; H hyperopia (farsighted); P presbyopia; Mrx spectacle prescription;  CTL contact lenses; OD right eye; OS left eye; OU both eyes  XT exotropia; ET esotropia; PEK punctate epithelial keratitis; PEE punctate epithelial erosions; DES dry eye syndrome; MGD meibomian gland dysfunction; ATs artificial tears; PFAT's preservative free artificial tears; [place] nuclear sclerotic cataract; PSC posterior subcapsular cataract; ERM epi-retinal membrane; PVD posterior vitreous detachment; RD retinal detachment; DM diabetes mellitus; DR diabetic retinopathy; NPDR non-proliferative diabetic retinopathy; PDR proliferative diabetic retinopathy; CSME clinically significant macular edema; DME diabetic macular edema; dbh dot blot hemorrhages; CWS cotton wool spot; POAG primary open angle glaucoma; C/D cup-to-disc ratio; HVF humphrey visual field; GVF goldmann visual field; OCT optical coherence tomography; IOP intraocular pressure; BRVO Branch retinal vein occlusion; CRVO central retinal vein occlusion; CRAO central retinal artery  occlusion; BRAO branch retinal artery occlusion; RT retinal tear; SB scleral buckle; PPV pars plana vitrectomy; VH Vitreous hemorrhage; PRP panretinal laser photocoagulation; IVK intravitreal kenalog; VMT vitreomacular traction; MH Macular hole;  NVD neovascularization of the disc; NVE neovascularization elsewhere; AREDS age related eye disease study; ARMD age related macular degeneration; POAG primary open angle glaucoma; EBMD epithelial/anterior basement membrane dystrophy; ACIOL anterior chamber intraocular lens; IOL intraocular lens; PCIOL posterior chamber intraocular lens; Phaco/IOL phacoemulsification with intraocular lens placement; [place] photorefractive keratectomy; LASIK laser assisted in situ keratomileusis; HTN hypertension; DM diabetes mellitus; COPD chronic obstructive pulmonary disease

## 2020-03-21 NOTE — Assessment & Plan Note (Signed)
The nature of branch retinal vein occlusion with neovascularization was discussed with the patient.  An informational brochure was given.  Treatment options including sector pan retinal photocoagulation were reviewed.  The patient's questions were answered.   OD this condition is now quiesced sent and stable

## 2020-04-17 DIAGNOSIS — I129 Hypertensive chronic kidney disease with stage 1 through stage 4 chronic kidney disease, or unspecified chronic kidney disease: Secondary | ICD-10-CM | POA: Diagnosis not present

## 2020-04-17 DIAGNOSIS — E1122 Type 2 diabetes mellitus with diabetic chronic kidney disease: Secondary | ICD-10-CM | POA: Diagnosis not present

## 2020-04-17 DIAGNOSIS — N182 Chronic kidney disease, stage 2 (mild): Secondary | ICD-10-CM | POA: Diagnosis not present

## 2020-04-17 DIAGNOSIS — E8881 Metabolic syndrome: Secondary | ICD-10-CM | POA: Diagnosis not present

## 2020-04-18 DEATH — deceased

## 2020-05-17 DIAGNOSIS — E1122 Type 2 diabetes mellitus with diabetic chronic kidney disease: Secondary | ICD-10-CM | POA: Diagnosis not present

## 2020-05-17 DIAGNOSIS — I129 Hypertensive chronic kidney disease with stage 1 through stage 4 chronic kidney disease, or unspecified chronic kidney disease: Secondary | ICD-10-CM | POA: Diagnosis not present

## 2020-05-17 DIAGNOSIS — N182 Chronic kidney disease, stage 2 (mild): Secondary | ICD-10-CM | POA: Diagnosis not present

## 2020-05-17 DIAGNOSIS — E039 Hypothyroidism, unspecified: Secondary | ICD-10-CM | POA: Diagnosis not present

## 2020-08-17 DIAGNOSIS — Z0001 Encounter for general adult medical examination with abnormal findings: Secondary | ICD-10-CM | POA: Diagnosis not present

## 2020-08-17 DIAGNOSIS — Z6829 Body mass index (BMI) 29.0-29.9, adult: Secondary | ICD-10-CM | POA: Diagnosis not present

## 2020-08-17 DIAGNOSIS — E119 Type 2 diabetes mellitus without complications: Secondary | ICD-10-CM | POA: Diagnosis not present

## 2020-08-17 DIAGNOSIS — Z1389 Encounter for screening for other disorder: Secondary | ICD-10-CM | POA: Diagnosis not present

## 2020-08-17 DIAGNOSIS — Z125 Encounter for screening for malignant neoplasm of prostate: Secondary | ICD-10-CM | POA: Diagnosis not present

## 2020-08-17 DIAGNOSIS — E039 Hypothyroidism, unspecified: Secondary | ICD-10-CM | POA: Diagnosis not present

## 2020-08-17 DIAGNOSIS — E7849 Other hyperlipidemia: Secondary | ICD-10-CM | POA: Diagnosis not present

## 2020-08-17 DIAGNOSIS — I10 Essential (primary) hypertension: Secondary | ICD-10-CM | POA: Diagnosis not present

## 2020-08-17 DIAGNOSIS — Z23 Encounter for immunization: Secondary | ICD-10-CM | POA: Diagnosis not present

## 2020-08-24 DIAGNOSIS — L570 Actinic keratosis: Secondary | ICD-10-CM | POA: Diagnosis not present

## 2020-08-24 DIAGNOSIS — D0439 Carcinoma in situ of skin of other parts of face: Secondary | ICD-10-CM | POA: Diagnosis not present

## 2020-08-24 DIAGNOSIS — X32XXXD Exposure to sunlight, subsequent encounter: Secondary | ICD-10-CM | POA: Diagnosis not present

## 2020-09-25 DIAGNOSIS — L570 Actinic keratosis: Secondary | ICD-10-CM | POA: Diagnosis not present

## 2020-09-25 DIAGNOSIS — Z85828 Personal history of other malignant neoplasm of skin: Secondary | ICD-10-CM | POA: Diagnosis not present

## 2020-09-25 DIAGNOSIS — Z08 Encounter for follow-up examination after completed treatment for malignant neoplasm: Secondary | ICD-10-CM | POA: Diagnosis not present

## 2020-09-25 DIAGNOSIS — X32XXXD Exposure to sunlight, subsequent encounter: Secondary | ICD-10-CM | POA: Diagnosis not present

## 2020-10-16 DIAGNOSIS — H43812 Vitreous degeneration, left eye: Secondary | ICD-10-CM | POA: Diagnosis not present

## 2020-10-16 DIAGNOSIS — E119 Type 2 diabetes mellitus without complications: Secondary | ICD-10-CM | POA: Diagnosis not present

## 2020-10-16 DIAGNOSIS — H31001 Unspecified chorioretinal scars, right eye: Secondary | ICD-10-CM | POA: Diagnosis not present

## 2020-10-16 DIAGNOSIS — H52203 Unspecified astigmatism, bilateral: Secondary | ICD-10-CM | POA: Diagnosis not present

## 2021-03-21 ENCOUNTER — Other Ambulatory Visit: Payer: Self-pay

## 2021-03-21 ENCOUNTER — Encounter (INDEPENDENT_AMBULATORY_CARE_PROVIDER_SITE_OTHER): Payer: Self-pay | Admitting: Ophthalmology

## 2021-03-21 ENCOUNTER — Ambulatory Visit (INDEPENDENT_AMBULATORY_CARE_PROVIDER_SITE_OTHER): Payer: Medicare Other | Admitting: Ophthalmology

## 2021-03-21 DIAGNOSIS — H348312 Tributary (branch) retinal vein occlusion, right eye, stable: Secondary | ICD-10-CM

## 2021-03-21 DIAGNOSIS — E119 Type 2 diabetes mellitus without complications: Secondary | ICD-10-CM

## 2021-03-21 NOTE — Assessment & Plan Note (Signed)
Compensated branch retinal vein occlusion right eye, now stable at 1 year follow-up again.  No new findings in either eye.  We will continue to observe

## 2021-03-21 NOTE — Progress Notes (Signed)
03/21/2021     CHIEF COMPLAINT Patient presents for Retina Follow Up (1 Year F/U OU//Pt denies noticeable changes to [place] OU since last visit. Pt denies ocular pain, flashes of light, or floaters OU. //)   HISTORY OF PRESENT ILLNESS: Jake Becker is a 85 y.o. male who presents to the clinic today for:   HPI    Retina Follow Up    Patient presents with  CRVO/BRVO.  In both eyes.  This started 1 year ago.  Severity is mild.  Duration of 1 year.  Since onset it is stable. Additional comments: 1 Year F/U OU  Pt denies noticeable changes to [place] OU since last visit. Pt denies ocular pain, flashes of light, or floaters OU.          Last edited by Rockie Neighbours, [place] on 03/21/2021  8:07 AM. (History)      Referring physician: Sharilyn Sites, MD [address] [place],  [place] [address]  HISTORICAL INFORMATION:   Selected notes from the MEDICAL RECORD NUMBER       CURRENT MEDICATIONS: Current Outpatient Medications (Ophthalmic Drugs)  Medication Sig  . Polyethyl Glycol-Propyl Glycol (SYSTANE) 0.4-0.3 % SOLN Apply 1 drop to eye every evening.   No current facility-administered medications for this visit. (Ophthalmic Drugs)   Current Outpatient Medications (Other)  Medication Sig  . atorvastatin (LIPITOR) 20 MG tablet Take 10 mg by mouth daily after supper.   Marland Kitchen BENICAR 40 MG tablet Take 40 mg by mouth daily.  . Cinnamon 500 MG capsule Take 500 mg by mouth daily.  Marland Kitchen levothyroxine (SYNTHROID, LEVOTHROID) 50 MCG tablet Take 50 mcg by mouth daily before breakfast.  . meloxicam (MOBIC) 15 MG tablet Take 15 mg by mouth daily.  . metFORMIN (GLUCOPHAGE) 500 MG tablet Take 500 mg by mouth 2 (two) times daily.  . psyllium (REGULOID) 0.52 g capsule Take 0.52 g by mouth daily.   No current facility-administered medications for this visit. (Other)      REVIEW OF SYSTEMS:    ALLERGIES Allergies  Allergen Reactions  . Oxycodone Other (See Comments)    Pt.see things    PAST  MEDICAL HISTORY Past Medical History:  Diagnosis Date  . Arthritis   . Diabetes mellitus without complication ([place])   . Hypertension   . Hypothyroidism   . Thyroid disease    Past Surgical History:  Procedure Laterality Date  . COLONOSCOPY N/A 06/06/2016   Procedure: COLONOSCOPY;  Surgeon: Rogene Houston, MD;  Location[facility];  Service: Endoscopy;  Laterality: N/A;  730  . EYE SURGERY     cataract    FAMILY HISTORY History reviewed. No pertinent family history.  SOCIAL HISTORY Social History   Tobacco Use  . Smoking status: Never Smoker  . Smokeless tobacco: Never Used  Substance Use Topics  . Alcohol use: No  . Drug use: No         OPHTHALMIC EXAM:  Base Eye Exam    Visual Acuity (ETDRS)      Right Left   Dist [place] 20/20 20/20 -1       Tonometry (Tonopen, 8:06 AM)      Right Left   Pressure 16 12       Pupils      Pupils Dark Light Shape React APD   Right PERRL 4 3 Round Brisk None   Left PERRL 4 3 Round Brisk None       Visual Fields (Counting fingers)  Left Right    Full    Restrictions  Total inferior temporal deficiency       Extraocular Movement      Right Left    Full Full       Neuro/Psych    Oriented x3: Yes   Mood/Affect: Normal       Dilation    Both eyes: 1.0% Mydriacyl, 2.5% Phenylephrine @ 8:06 AM        Slit Lamp and Fundus Exam    External Exam      Right Left   External Normal Normal       Slit Lamp Exam      Right Left   Lids/Lashes Normal Normal   Conjunctiva/Sclera White and quiet White and quiet   Cornea Clear Clear   Anterior Chamber Deep and quiet Deep and quiet   Iris Round and reactive Round and reactive   Lens Posterior chamber intraocular lens, PC open Posterior chamber intraocular lens, PC open   Anterior Vitreous Normal Normal       Fundus Exam      Right Left   Posterior Vitreous Minor vitreal papillary traction Normal   Disc Collaterals Normal   C/D Ratio 0.2 0.2   Macula  Microaneurysms, no hemorrhage, no macular thickening, Hard drusen Normal   Vessels Old macular branch retinal vein occlusion superiorly, stable  NO DR   Periphery Normal Normal          IMAGING AND PROCEDURES  Imaging and Procedures for 03/21/21  OCT, Retina - OU - Both Eyes       Right Eye Quality was good. Scan locations included subfoveal. Central Foveal Thickness: 318. Progression has been stable. Findings include abnormal foveal contour.   Left Eye Quality was good. Scan locations included subfoveal. Central Foveal Thickness: 291. Progression has been stable. Findings include normal observations.   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  No new findings in either eye.  We will continue to observe  Diabetes mellitus without complication ([place]) No diabetic retinopathy OU observe      ICD-10-CM   1. 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 Diabetes mellitus without complication ([facility]) Chronic E11.9     1.  No interval change over the last 1 year.  No significant findings of retinopathy related to diabetic eye disease.    2.  Old branch retinal vein occlusion of right eye treated in years past, stable no recurrence of cystoid macular edema observe  3.  Ophthalmic Meds Ordered this visit:  No orders of the defined types were placed in this encounter.      Return in about 1 year (around 03/21/2022) for DILATE OU, OCT.  There are no Patient Instructions on file for this visit.   Explained the diagnoses, plan, and follow up with the patient and they expressed understanding.  Patient expressed understanding  of the importance of proper follow up care.   Clent Demark Jabarie Pop M.D. Diseases & Surgery of the Retina and Vitreous Retina & Diabetic [place] 03/21/21     Abbreviations: M myopia (  nearsighted); A astigmatism; H hyperopia (farsighted); P presbyopia; Mrx spectacle prescription;  CTL contact lenses; OD right eye; OS left eye; OU both eyes  XT exotropia; ET esotropia; PEK punctate epithelial keratitis; PEE punctate epithelial erosions; DES dry eye syndrome; MGD meibomian gland dysfunction; ATs artificial tears; PFAT's preservative free artificial tears; [place] nuclear sclerotic cataract; PSC posterior subcapsular cataract; ERM epi-retinal membrane; PVD posterior vitreous detachment; RD retinal detachment; DM diabetes mellitus; DR diabetic retinopathy; NPDR non-proliferative diabetic retinopathy; PDR proliferative diabetic retinopathy; CSME clinically significant macular edema; DME diabetic macular edema; dbh dot blot hemorrhages; CWS cotton wool spot; POAG primary open angle glaucoma; C/D cup-to-disc ratio; HVF humphrey visual field; GVF goldmann visual field; OCT optical coherence tomography; IOP intraocular pressure; BRVO Branch retinal vein occlusion; CRVO central retinal vein occlusion; CRAO central retinal artery occlusion; BRAO branch retinal artery occlusion; RT retinal tear; SB scleral buckle; PPV pars plana vitrectomy; VH Vitreous hemorrhage; PRP panretinal laser photocoagulation; IVK intravitreal kenalog; VMT vitreomacular traction; MH Macular hole;  NVD neovascularization of the disc; NVE neovascularization elsewhere; AREDS age related eye disease study; ARMD age related macular degeneration; POAG primary open angle glaucoma; EBMD epithelial/anterior basement membrane dystrophy; ACIOL anterior chamber intraocular lens; IOL intraocular lens; PCIOL posterior chamber intraocular lens; Phaco/IOL phacoemulsification with intraocular lens placement; [place] photorefractive keratectomy; LASIK laser assisted in  situ keratomileusis; HTN hypertension; DM diabetes mellitus; COPD chronic obstructive pulmonary disease

## 2021-03-21 NOTE — Assessment & Plan Note (Signed)
No diabetic retinopathy OU observe

## 2021-04-18 DIAGNOSIS — E039 Hypothyroidism, unspecified: Secondary | ICD-10-CM | POA: Diagnosis not present

## 2021-04-18 DIAGNOSIS — Z1331 Encounter for screening for depression: Secondary | ICD-10-CM | POA: Diagnosis not present

## 2021-04-18 DIAGNOSIS — I129 Hypertensive chronic kidney disease with stage 1 through stage 4 chronic kidney disease, or unspecified chronic kidney disease: Secondary | ICD-10-CM | POA: Diagnosis not present

## 2021-04-18 DIAGNOSIS — E118 Type 2 diabetes mellitus with unspecified complications: Secondary | ICD-10-CM | POA: Diagnosis not present

## 2021-04-18 DIAGNOSIS — E663 Overweight: Secondary | ICD-10-CM | POA: Diagnosis not present

## 2021-04-18 DIAGNOSIS — Z1389 Encounter for screening for other disorder: Secondary | ICD-10-CM | POA: Diagnosis not present

## 2021-04-18 DIAGNOSIS — E7849 Other hyperlipidemia: Secondary | ICD-10-CM | POA: Diagnosis not present

## 2021-04-18 DIAGNOSIS — Z6829 Body mass index (BMI) 29.0-29.9, adult: Secondary | ICD-10-CM | POA: Diagnosis not present

## 2021-05-24 DIAGNOSIS — Z85828 Personal history of other malignant neoplasm of skin: Secondary | ICD-10-CM | POA: Diagnosis not present

## 2021-05-24 DIAGNOSIS — Z08 Encounter for follow-up examination after completed treatment for malignant neoplasm: Secondary | ICD-10-CM | POA: Diagnosis not present

## 2021-09-05 DIAGNOSIS — Z23 Encounter for immunization: Secondary | ICD-10-CM | POA: Diagnosis not present

## 2021-10-08 ENCOUNTER — Ambulatory Visit (HOSPITAL_COMMUNITY)
Admission: RE | Admit: 2021-10-08 | Discharge: 2021-10-08 | Disposition: A | Discharge status: Home / Self Care | Payer: Medicare Other | Source: Ambulatory Visit | Attending: Family Medicine | Admitting: Family Medicine

## 2021-10-08 ENCOUNTER — Other Ambulatory Visit (HOSPITAL_COMMUNITY): Payer: Self-pay | Admitting: Family Medicine

## 2021-10-08 ENCOUNTER — Other Ambulatory Visit: Payer: Self-pay

## 2021-10-08 DIAGNOSIS — M542 Cervicalgia: Secondary | ICD-10-CM | POA: Insufficient documentation

## 2021-10-08 DIAGNOSIS — E663 Overweight: Secondary | ICD-10-CM | POA: Diagnosis not present

## 2021-10-08 DIAGNOSIS — I129 Hypertensive chronic kidney disease with stage 1 through stage 4 chronic kidney disease, or unspecified chronic kidney disease: Secondary | ICD-10-CM | POA: Diagnosis not present

## 2021-10-08 DIAGNOSIS — E039 Hypothyroidism, unspecified: Secondary | ICD-10-CM | POA: Diagnosis not present

## 2021-10-08 DIAGNOSIS — E782 Mixed hyperlipidemia: Secondary | ICD-10-CM | POA: Diagnosis not present

## 2021-10-08 DIAGNOSIS — E118 Type 2 diabetes mellitus with unspecified complications: Secondary | ICD-10-CM | POA: Diagnosis not present

## 2021-10-08 DIAGNOSIS — Z6829 Body mass index (BMI) 29.0-29.9, adult: Secondary | ICD-10-CM | POA: Diagnosis not present

## 2021-10-09 DIAGNOSIS — E7849 Other hyperlipidemia: Secondary | ICD-10-CM | POA: Diagnosis not present

## 2021-10-09 DIAGNOSIS — E039 Hypothyroidism, unspecified: Secondary | ICD-10-CM | POA: Diagnosis not present

## 2021-10-09 DIAGNOSIS — E118 Type 2 diabetes mellitus with unspecified complications: Secondary | ICD-10-CM | POA: Diagnosis not present

## 2021-10-23 DIAGNOSIS — D729 Disorder of white blood cells, unspecified: Secondary | ICD-10-CM | POA: Diagnosis not present

## 2022-01-14 DIAGNOSIS — E039 Hypothyroidism, unspecified: Secondary | ICD-10-CM | POA: Diagnosis not present

## 2022-01-14 DIAGNOSIS — M542 Cervicalgia: Secondary | ICD-10-CM | POA: Diagnosis not present

## 2022-01-14 DIAGNOSIS — E782 Mixed hyperlipidemia: Secondary | ICD-10-CM | POA: Diagnosis not present

## 2022-01-14 DIAGNOSIS — E118 Type 2 diabetes mellitus with unspecified complications: Secondary | ICD-10-CM | POA: Diagnosis not present

## 2022-01-14 DIAGNOSIS — Z6829 Body mass index (BMI) 29.0-29.9, adult: Secondary | ICD-10-CM | POA: Diagnosis not present

## 2022-01-14 DIAGNOSIS — E6609 Other obesity due to excess calories: Secondary | ICD-10-CM | POA: Diagnosis not present

## 2022-03-25 ENCOUNTER — Ambulatory Visit (INDEPENDENT_AMBULATORY_CARE_PROVIDER_SITE_OTHER): Payer: No Typology Code available for payment source | Admitting: Ophthalmology

## 2022-03-25 ENCOUNTER — Encounter (INDEPENDENT_AMBULATORY_CARE_PROVIDER_SITE_OTHER): Payer: Self-pay | Admitting: Ophthalmology

## 2022-03-25 DIAGNOSIS — H3521 Other non-diabetic proliferative retinopathy, right eye: Secondary | ICD-10-CM

## 2022-03-25 DIAGNOSIS — H348312 Tributary (branch) retinal vein occlusion, right eye, stable: Secondary | ICD-10-CM

## 2022-03-25 DIAGNOSIS — E119 Type 2 diabetes mellitus without complications: Secondary | ICD-10-CM | POA: Diagnosis not present

## 2022-03-25 NOTE — Assessment & Plan Note (Signed)
Treated years ago with periodic antivegF and then subsequently sector PRP, stable with no recurrence ?

## 2022-03-25 NOTE — Progress Notes (Signed)
? ? ?03/25/2022 ? ?  ? ?CHIEF COMPLAINT ?Patient presents for  ?Chief Complaint  ?Patient presents with  ? Retina Follow Up  ? ? ? ? ?HISTORY OF PRESENT ILLNESS: ?Jake Becker is a 86 y.o. male who presents to the clinic today for:  ? ?HPI   ? ? Retina Follow Up   ? ?      ? Diagnosis: Tributary Retinal Vein Occlusion  ? Laterality: right eye  ? Onset: 1 year ago  ? ?  ?  ? ? Comments   ?1 yr fu OU OCT. ?Patient states vision is stable and unchanged since last visit. Denies any new floaters or FOL. ?Patient reports no hospitalizations or surgeries since last visit, and is not using any prescription eye drops. ? ? ?  ?  ?Last edited by Laurin Coder on 03/25/2022  8:14 AM.  ?  ? ? ?Referring physician: ?Sharilyn Sites, MD ?[address] ?[place],  [place] [address] ? ?HISTORICAL INFORMATION:  ? ?Selected notes from the [facility] ?  ?   ? ?CURRENT MEDICATIONS: ?Current Outpatient Medications (Ophthalmic Drugs)  ?Medication Sig  ? Polyethyl Glycol-Propyl Glycol (SYSTANE) 0.4-0.3 % SOLN Apply 1 drop to eye every evening.  ? ?No current facility-administered medications for this visit. (Ophthalmic Drugs)  ? ?Current Outpatient Medications (Other)  ?Medication Sig  ? atorvastatin (LIPITOR) 20 MG tablet Take 10 mg by mouth daily after supper.   ? BENICAR 40 MG tablet Take 40 mg by mouth daily.  ? Cinnamon 500 MG capsule Take 500 mg by mouth daily.  ? levothyroxine (SYNTHROID, LEVOTHROID) 50 MCG tablet Take 50 mcg by mouth daily before breakfast.  ? meloxicam (MOBIC) 15 MG tablet Take 15 mg by mouth daily.  ? metFORMIN (GLUCOPHAGE) 500 MG tablet Take 500 mg by mouth 2 (two) times daily.  ? psyllium (REGULOID) 0.52 g capsule Take 0.52 g by mouth daily.  ? ?No current facility-administered medications for this visit. (Other)  ? ? ? ? ?REVIEW OF SYSTEMS: ?ROS   ?Negative for: Constitutional, Gastrointestinal, Neurological, Skin, Genitourinary, Musculoskeletal, HENT, Endocrine, Cardiovascular, Eyes, Respiratory,  Psychiatric, Allergic/Imm, Heme/Lymph ?Last edited by Hurman Horn, MD on 03/25/2022  8:59 AM.  ?  ? ? ? ?ALLERGIES ?Allergies  ?Allergen Reactions  ? Oxycodone Other (See Comments)  ?  Pt.see things  ? ? ?PAST MEDICAL HISTORY ?Past Medical History:  ?Diagnosis Date  ? Arthritis   ? Diabetes mellitus without complication ([place])   ? Hypertension   ? Hypothyroidism   ? Thyroid disease   ? ?Past Surgical History:  ?Procedure Laterality Date  ? COLONOSCOPY N/A 06/06/2016  ? Procedure: COLONOSCOPY;  Surgeon: Rogene Houston, MD;  Location: [facility];  Service: Endoscopy;  Laterality: N/A;  730  ? EYE SURGERY    ? cataract  ? ? ?FAMILY HISTORY ?History reviewed. No pertinent family history. ? ?SOCIAL HISTORY ?Social History  ? ?Tobacco Use  ? Smoking status: Never  ? Smokeless tobacco: Never  ?Substance Use Topics  ? Alcohol use: No  ? Drug use: No  ? ?  ? ?  ? ?OPHTHALMIC EXAM: ? ?Base Eye Exam   ? ? Visual Acuity (ETDRS)   ? ?   Right Left  ? Dist [place] 20/20 -1 20/20 -1  ? ?  ?  ? ? Tonometry (Tonopen, 8:15 AM)   ? ?   Right Left  ? Pressure 12 9  ? ?  ?  ? ? Pupils   ? ?  Pupils Dark Light APD  ? Right PERRL 4 3 None  ? Left PERRL 4 3 None  ? ?  ?  ? ? Visual Fields (Counting fingers)   ? ?   Left Right  ?  Full   ? Restrictions  Total inferior temporal deficiency  ? ?  ?  ? ? Extraocular Movement   ? ?   Right Left  ?  Full Full  ? ?  ?  ? ? Neuro/Psych   ? ? Oriented x3: Yes  ? Mood/Affect: Normal  ? ?  ?  ? ? Dilation   ? ? Both eyes: 1.0% Mydriacyl, 2.5% Phenylephrine @ 8:15 AM  ? ?  ?  ? ?  ? ?Slit Lamp and Fundus Exam   ? ? External Exam   ? ?   Right Left  ? External Normal Normal  ? ?  ?  ? ? Slit Lamp Exam   ? ?   Right Left  ? Lids/Lashes Normal Normal  ? Conjunctiva/Sclera White and quiet White and quiet  ? Cornea Clear Clear  ? Anterior Chamber Deep and quiet Deep and quiet  ? Iris Round and reactive Round and reactive  ? Lens Posterior chamber intraocular lens, PC open Posterior chamber intraocular lens,  PC open  ? Anterior Vitreous Normal Normal  ? ?  ?  ? ? Fundus Exam   ? ?   Right Left  ? Posterior Vitreous Minor vitreal papillary traction Normal  ? Disc Collaterals Normal  ? C/D Ratio 0.2 0.2  ? Macula Microaneurysms, no hemorrhage, no macular thickening, Hard drusen Normal  ? Vessels Old macular branch retinal vein occlusion superiorly, stable, collaterals temporally  NO DR  ? Periphery Normal Normal  ? ?  ?  ? ?  ? ? ?IMAGING AND PROCEDURES  ?Imaging and Procedures for 03/25/22 ? ?OCT, Retina - OU - Both Eyes   ? ?   ?Right Eye ?Quality was good. Scan locations included subfoveal. Central Foveal Thickness: 314. Progression has been stable. Findings include abnormal foveal contour.  ? ?Left Eye ?Quality was good. Scan locations included subfoveal. Central Foveal Thickness: 289. Progression has been stable. Findings include normal observations, vitreomacular adhesion .  ? ?Notes ?OD mild atrophy along the superotemporal arcade in the region of prior retinal vein occlusion which is now stable and nonactive ? ?OS, with partial PVD OS yet with VMA temporally and normal foveal architecture ? ?  ? ? ?  ?  ? ?  ?ASSESSMENT/PLAN: ? ?Diabetes mellitus without complication ([facility]) ?Continue to monitor observe ? 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 ?  ICD-10-CM   ?1. 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  ?2. Diabetes mellitus without complication ([facility])  I96.7   ?  ?3. Other non-diabetic proliferative retinopathy, right eye  H35.21   ?  ? ? ?1.  OU stable overall. ? ?2.  No detectable diabetic retinopathy OU ? ?3.  Old superotemporal BRVO OD stable, no macular edema we will continue to monitor and observe ? ?Ophthalmic Meds Ordered this visit:  ?No orders of the defined types were placed in this encounter. ? ? ?  ? ?Return in about 1 year (around 03/26/2023) for  DILATE OU, COLOR FP, OCT. ? ?There are no Patient Instructions on file for this visit. ? ? ?  Explained the diagnoses, plan, and follow up with the patient and they expressed understanding.  Patient expressed understanding of the importance of proper follow up care.  ? ?Clent Demark. Laverna Dossett M.D. ?Diseases & Surgery of the Retina and Vitreous ?[facility] ?03/25/22 ? ? ? ? ?Abbreviations: ?M myopia (nearsighted); A astigmatism; H hyperopia (farsighted); P presbyopia; Mrx spectacle prescription;  CTL contact lenses; OD right eye; OS left eye; OU both eyes  XT exotropia; ET esotropia; PEK punctate epithelial keratitis; PEE punctate epithelial erosions; DES dry eye syndrome; MGD meibomian gland dysfunction; ATs artificial tears; PFAT's preservative free artificial tears; [place] nuclear sclerotic cataract; PSC posterior subcapsular cataract; ERM epi-retinal membrane; PVD posterior vitreous detachment; RD retinal detachment; DM diabetes mellitus; DR diabetic retinopathy; NPDR non-proliferative diabetic retinopathy; PDR proliferative diabetic retinopathy; CSME clinically significant macular edema; DME diabetic macular edema; dbh dot blot hemorrhages; CWS cotton wool spot; POAG primary open angle glaucoma; C/D cup-to-disc ratio; HVF humphrey visual field; GVF goldmann visual field; OCT optical coherence tomography; IOP intraocular pressure; BRVO Branch retinal vein occlusion; CRVO central retinal vein occlusion; CRAO central retinal artery occlusion; BRAO branch retinal artery occlusion; RT retinal tear; SB scleral buckle; PPV pars plana vitrectomy; VH Vitreous hemorrhage; PRP panretinal laser photocoagulation; IVK intravitreal kenalog; VMT vitreomacular traction; MH Macular hole;  NVD neovascularization of the disc; NVE neovascularization elsewhere; AREDS age related eye disease study; ARMD age related macular degeneration; POAG primary open angle glaucoma; EBMD epithelial/anterior basement membrane dystrophy; ACIOL  anterior chamber intraocular lens; IOL intraocular lens; PCIOL posterior chamber intraocular lens; Phaco/IOL phacoemulsification with intraocular lens placement; [place] photorefractive keratectomy; LASIK la

## 2022-03-25 NOTE — Assessment & Plan Note (Signed)
Continue to monitor observe ?

## 2022-08-20 DIAGNOSIS — E118 Type 2 diabetes mellitus with unspecified complications: Secondary | ICD-10-CM | POA: Diagnosis not present

## 2022-08-20 DIAGNOSIS — E039 Hypothyroidism, unspecified: Secondary | ICD-10-CM | POA: Diagnosis not present

## 2022-08-20 DIAGNOSIS — Z23 Encounter for immunization: Secondary | ICD-10-CM | POA: Diagnosis not present

## 2022-08-20 DIAGNOSIS — Z1331 Encounter for screening for depression: Secondary | ICD-10-CM | POA: Diagnosis not present

## 2022-08-20 DIAGNOSIS — Z6829 Body mass index (BMI) 29.0-29.9, adult: Secondary | ICD-10-CM | POA: Diagnosis not present

## 2022-08-20 DIAGNOSIS — E7849 Other hyperlipidemia: Secondary | ICD-10-CM | POA: Diagnosis not present

## 2022-08-20 DIAGNOSIS — I129 Hypertensive chronic kidney disease with stage 1 through stage 4 chronic kidney disease, or unspecified chronic kidney disease: Secondary | ICD-10-CM | POA: Diagnosis not present

## 2022-08-20 DIAGNOSIS — E663 Overweight: Secondary | ICD-10-CM | POA: Diagnosis not present

## 2022-08-20 DIAGNOSIS — Z0001 Encounter for general adult medical examination with abnormal findings: Secondary | ICD-10-CM | POA: Diagnosis not present

## 2022-08-20 DIAGNOSIS — E119 Type 2 diabetes mellitus without complications: Secondary | ICD-10-CM | POA: Diagnosis not present

## 2022-08-20 DIAGNOSIS — E782 Mixed hyperlipidemia: Secondary | ICD-10-CM | POA: Diagnosis not present

## 2022-08-30 DIAGNOSIS — E663 Overweight: Secondary | ICD-10-CM | POA: Diagnosis not present

## 2022-08-30 DIAGNOSIS — Z6828 Body mass index (BMI) 28.0-28.9, adult: Secondary | ICD-10-CM | POA: Diagnosis not present

## 2022-08-30 DIAGNOSIS — E039 Hypothyroidism, unspecified: Secondary | ICD-10-CM | POA: Diagnosis not present

## 2022-08-30 DIAGNOSIS — R2681 Unsteadiness on feet: Secondary | ICD-10-CM | POA: Diagnosis not present

## 2022-08-30 DIAGNOSIS — I10 Essential (primary) hypertension: Secondary | ICD-10-CM | POA: Diagnosis not present

## 2022-08-30 DIAGNOSIS — E785 Hyperlipidemia, unspecified: Secondary | ICD-10-CM | POA: Diagnosis not present

## 2022-08-30 DIAGNOSIS — Z008 Encounter for other general examination: Secondary | ICD-10-CM | POA: Diagnosis not present

## 2023-02-28 DIAGNOSIS — I129 Hypertensive chronic kidney disease with stage 1 through stage 4 chronic kidney disease, or unspecified chronic kidney disease: Secondary | ICD-10-CM | POA: Diagnosis not present

## 2023-02-28 DIAGNOSIS — E782 Mixed hyperlipidemia: Secondary | ICD-10-CM | POA: Diagnosis not present

## 2023-02-28 DIAGNOSIS — Z6829 Body mass index (BMI) 29.0-29.9, adult: Secondary | ICD-10-CM | POA: Diagnosis not present

## 2023-02-28 DIAGNOSIS — J069 Acute upper respiratory infection, unspecified: Secondary | ICD-10-CM | POA: Diagnosis not present

## 2023-02-28 DIAGNOSIS — E039 Hypothyroidism, unspecified: Secondary | ICD-10-CM | POA: Diagnosis not present

## 2023-02-28 DIAGNOSIS — E1129 Type 2 diabetes mellitus with other diabetic kidney complication: Secondary | ICD-10-CM | POA: Diagnosis not present

## 2023-02-28 DIAGNOSIS — E663 Overweight: Secondary | ICD-10-CM | POA: Diagnosis not present

## 2023-02-28 DIAGNOSIS — D729 Disorder of white blood cells, unspecified: Secondary | ICD-10-CM | POA: Diagnosis not present

## 2023-03-26 ENCOUNTER — Encounter (INDEPENDENT_AMBULATORY_CARE_PROVIDER_SITE_OTHER): Payer: No Typology Code available for payment source | Admitting: Ophthalmology

## 2023-04-07 DIAGNOSIS — E118 Type 2 diabetes mellitus with unspecified complications: Secondary | ICD-10-CM | POA: Diagnosis not present

## 2023-04-07 DIAGNOSIS — E7849 Other hyperlipidemia: Secondary | ICD-10-CM | POA: Diagnosis not present

## 2023-04-07 DIAGNOSIS — D729 Disorder of white blood cells, unspecified: Secondary | ICD-10-CM | POA: Diagnosis not present

## 2023-04-08 DIAGNOSIS — E119 Type 2 diabetes mellitus without complications: Secondary | ICD-10-CM | POA: Diagnosis not present

## 2023-04-08 DIAGNOSIS — H348312 Tributary (branch) retinal vein occlusion, right eye, stable: Secondary | ICD-10-CM | POA: Diagnosis not present

## 2023-04-08 DIAGNOSIS — H3521 Other non-diabetic proliferative retinopathy, right eye: Secondary | ICD-10-CM | POA: Diagnosis not present

## 2023-04-08 DIAGNOSIS — H35031 Hypertensive retinopathy, right eye: Secondary | ICD-10-CM | POA: Diagnosis not present

## 2023-05-29 DIAGNOSIS — Z08 Encounter for follow-up examination after completed treatment for malignant neoplasm: Secondary | ICD-10-CM | POA: Diagnosis not present

## 2023-05-29 DIAGNOSIS — X32XXXD Exposure to sunlight, subsequent encounter: Secondary | ICD-10-CM | POA: Diagnosis not present

## 2023-05-29 DIAGNOSIS — L821 Other seborrheic keratosis: Secondary | ICD-10-CM | POA: Diagnosis not present

## 2023-05-29 DIAGNOSIS — Z85828 Personal history of other malignant neoplasm of skin: Secondary | ICD-10-CM | POA: Diagnosis not present

## 2023-05-29 DIAGNOSIS — L570 Actinic keratosis: Secondary | ICD-10-CM | POA: Diagnosis not present

## 2023-08-29 DIAGNOSIS — E663 Overweight: Secondary | ICD-10-CM | POA: Diagnosis not present

## 2023-08-29 DIAGNOSIS — Z23 Encounter for immunization: Secondary | ICD-10-CM | POA: Diagnosis not present

## 2023-08-29 DIAGNOSIS — E039 Hypothyroidism, unspecified: Secondary | ICD-10-CM | POA: Diagnosis not present

## 2023-08-29 DIAGNOSIS — N401 Enlarged prostate with lower urinary tract symptoms: Secondary | ICD-10-CM | POA: Diagnosis not present

## 2023-08-29 DIAGNOSIS — I129 Hypertensive chronic kidney disease with stage 1 through stage 4 chronic kidney disease, or unspecified chronic kidney disease: Secondary | ICD-10-CM | POA: Diagnosis not present

## 2023-08-29 DIAGNOSIS — Z0001 Encounter for general adult medical examination with abnormal findings: Secondary | ICD-10-CM | POA: Diagnosis not present

## 2023-08-29 DIAGNOSIS — Z6829 Body mass index (BMI) 29.0-29.9, adult: Secondary | ICD-10-CM | POA: Diagnosis not present

## 2023-08-29 DIAGNOSIS — E1159 Type 2 diabetes mellitus with other circulatory complications: Secondary | ICD-10-CM | POA: Diagnosis not present

## 2023-08-29 DIAGNOSIS — Z1331 Encounter for screening for depression: Secondary | ICD-10-CM | POA: Diagnosis not present

## 2023-08-29 DIAGNOSIS — E782 Mixed hyperlipidemia: Secondary | ICD-10-CM | POA: Diagnosis not present

## 2023-08-29 DIAGNOSIS — D729 Disorder of white blood cells, unspecified: Secondary | ICD-10-CM | POA: Diagnosis not present

## 2023-09-19 DIAGNOSIS — Z008 Encounter for other general examination: Secondary | ICD-10-CM | POA: Diagnosis not present

## 2023-09-19 DIAGNOSIS — E663 Overweight: Secondary | ICD-10-CM | POA: Diagnosis not present

## 2023-09-25 DIAGNOSIS — E663 Overweight: Secondary | ICD-10-CM | POA: Diagnosis not present

## 2023-09-25 DIAGNOSIS — Z6828 Body mass index (BMI) 28.0-28.9, adult: Secondary | ICD-10-CM | POA: Diagnosis not present

## 2023-09-25 DIAGNOSIS — S139XXA Sprain of joints and ligaments of unspecified parts of neck, initial encounter: Secondary | ICD-10-CM | POA: Diagnosis not present

## 2023-09-25 DIAGNOSIS — B349 Viral infection, unspecified: Secondary | ICD-10-CM | POA: Diagnosis not present

## 2023-10-06 DIAGNOSIS — H35031 Hypertensive retinopathy, right eye: Secondary | ICD-10-CM | POA: Diagnosis not present

## 2023-10-06 DIAGNOSIS — H348312 Tributary (branch) retinal vein occlusion, right eye, stable: Secondary | ICD-10-CM | POA: Diagnosis not present

## 2023-10-06 DIAGNOSIS — E119 Type 2 diabetes mellitus without complications: Secondary | ICD-10-CM | POA: Diagnosis not present

## 2023-10-06 DIAGNOSIS — H3521 Other non-diabetic proliferative retinopathy, right eye: Secondary | ICD-10-CM | POA: Diagnosis not present

## 2023-10-12 IMAGING — DX DG CERVICAL SPINE 2 OR 3 VIEWS
4 series · 4 of 4 positions shown · non-contrast
Comparison: None.

CLINICAL DATA: Neck pain.

EXAM:
CERVICAL SPINE - 2-3 VIEW

[c-spine lat]
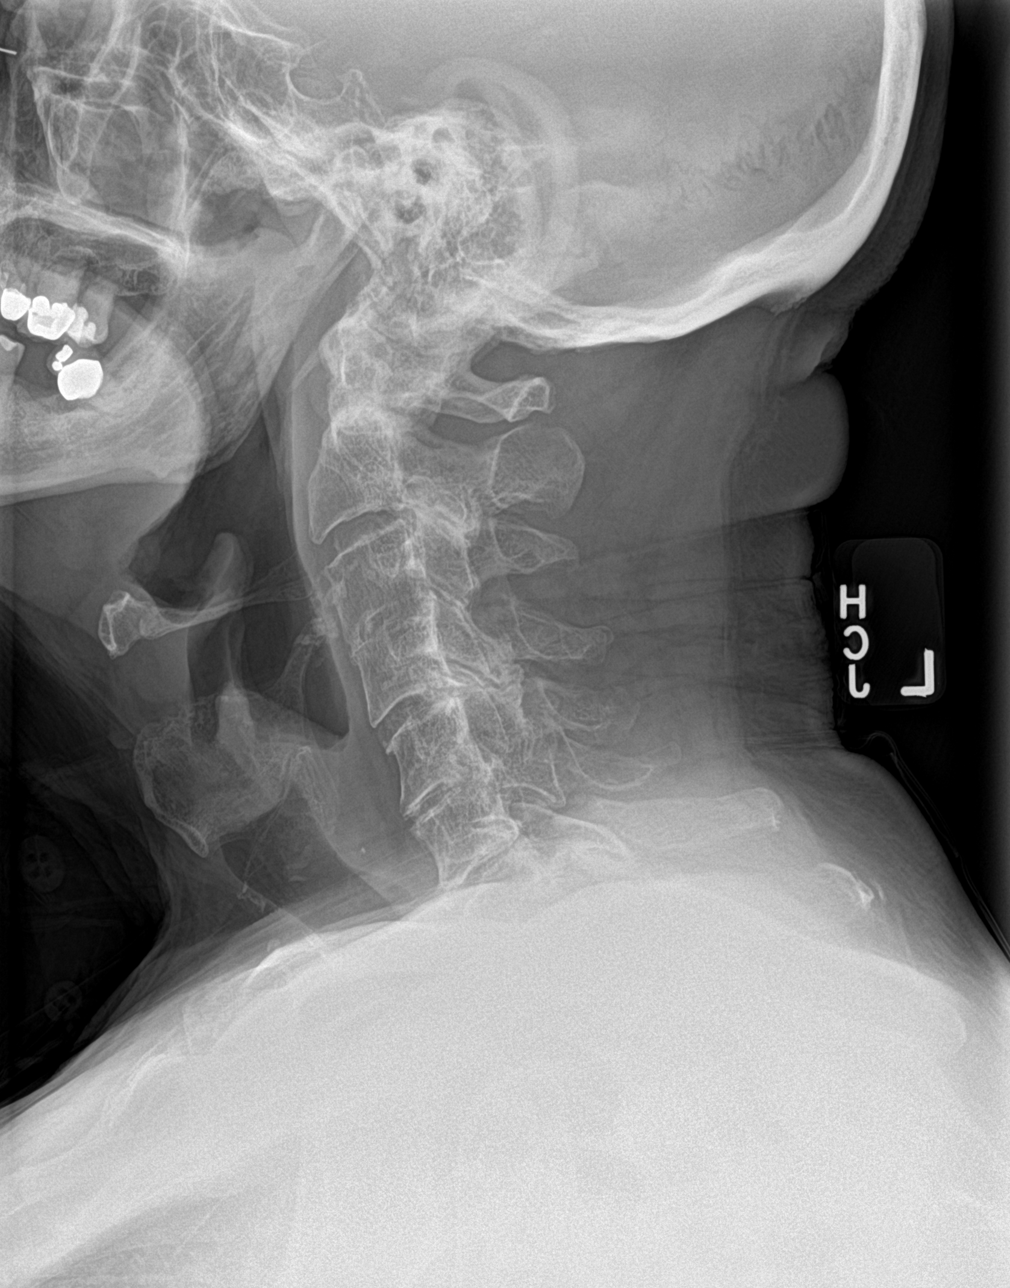

[c-spine ap]
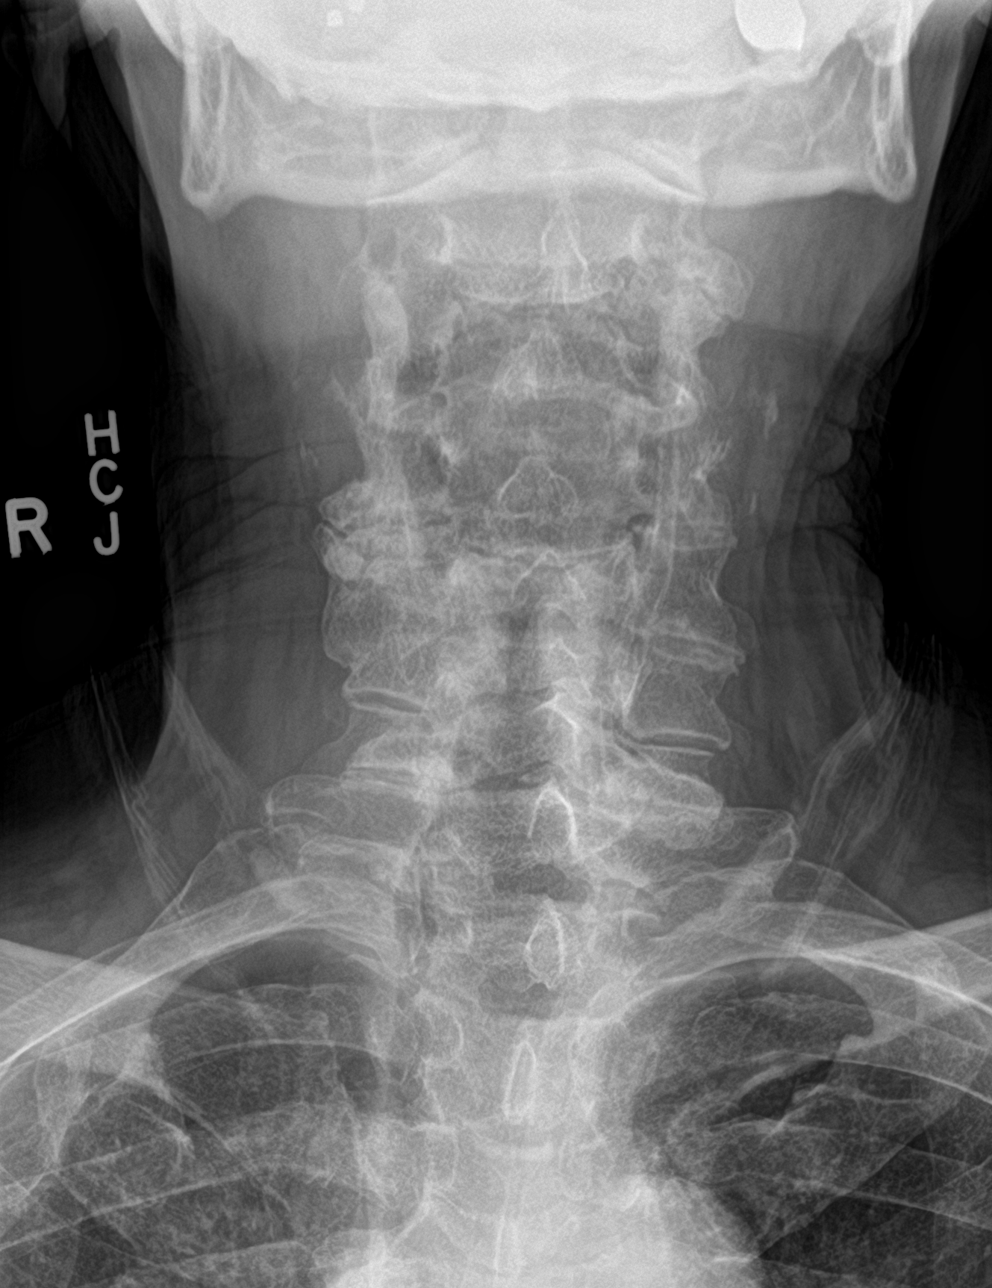

[c-spine open mouth]
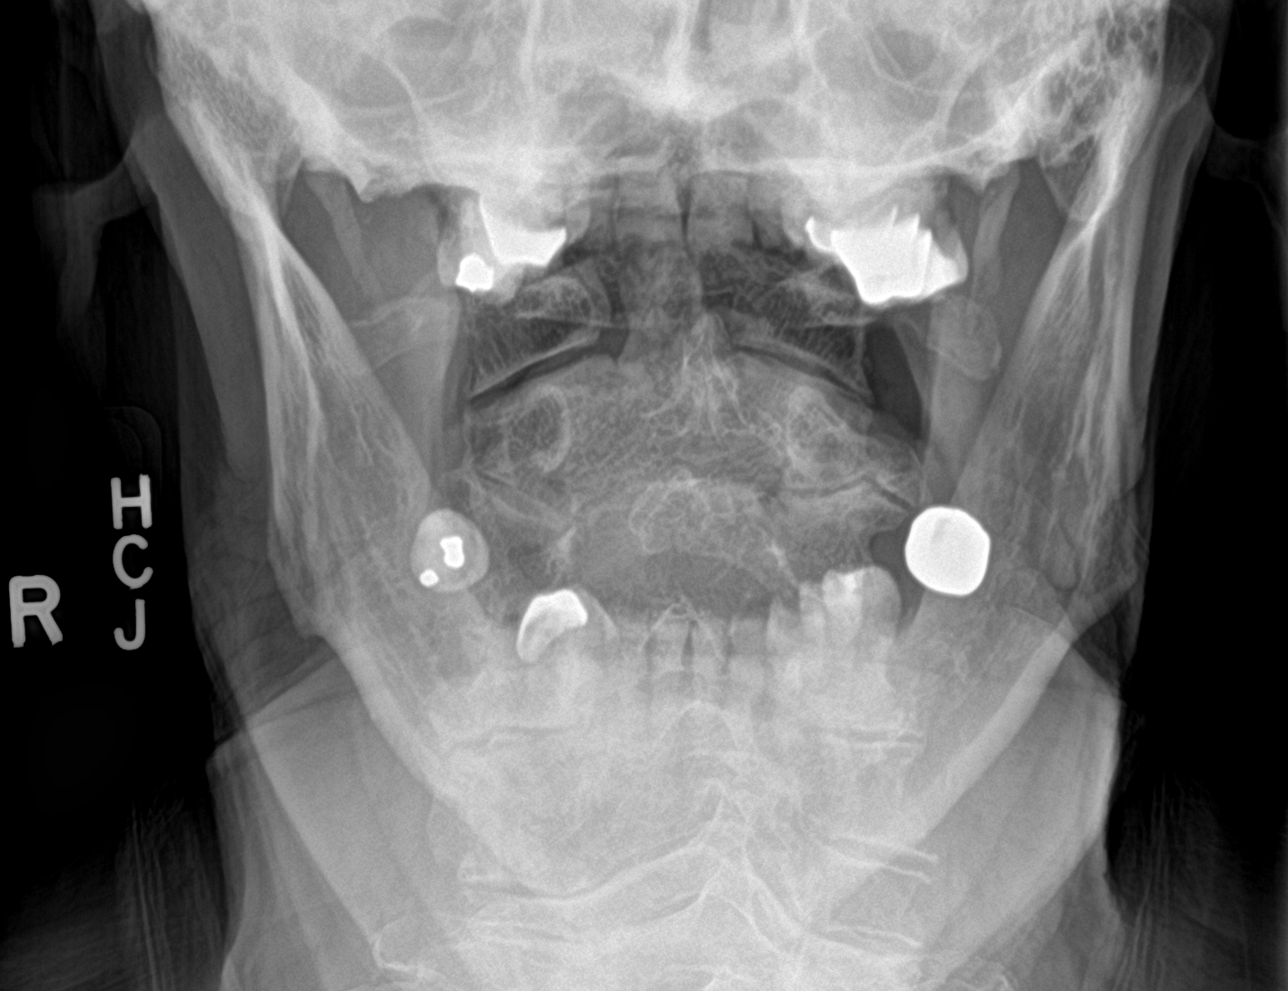

[c-spine swimmers trauma]
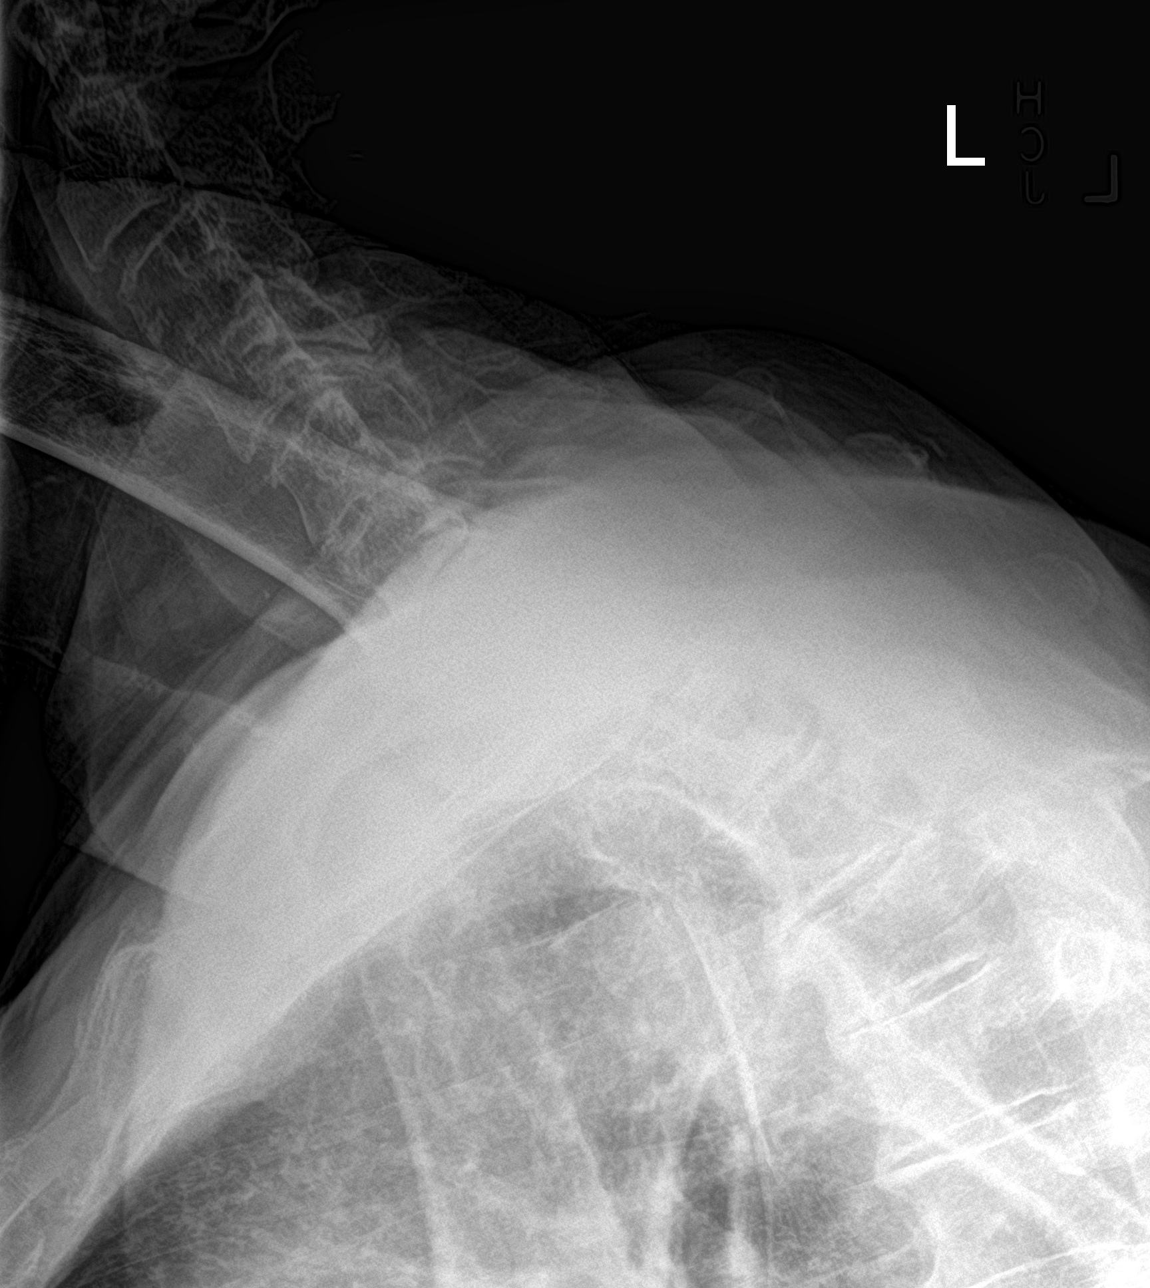

[4 of 4 positions shown; findings below may reference images not displayed]

FINDINGS: There is no acute fracture or dislocation. The bones are osteopenic
with multilevel degenerative changes with disc space narrowing.
Multilevel facet arthropathy. The visualized posterior elements and
odontoid appear intact. There is anatomic alignment of the lateral
masses of C1 and C2. The soft tissues are unremarkable
IMPRESSION: 1. No acute fracture or dislocation.
2. Multilevel degenerative changes.

## 2024-01-16 DIAGNOSIS — Z6828 Body mass index (BMI) 28.0-28.9, adult: Secondary | ICD-10-CM | POA: Diagnosis not present

## 2024-01-16 DIAGNOSIS — H6120 Impacted cerumen, unspecified ear: Secondary | ICD-10-CM | POA: Diagnosis not present

## 2024-01-16 DIAGNOSIS — E663 Overweight: Secondary | ICD-10-CM | POA: Diagnosis not present

## 2024-02-27 DIAGNOSIS — D729 Disorder of white blood cells, unspecified: Secondary | ICD-10-CM | POA: Diagnosis not present

## 2024-02-27 DIAGNOSIS — E782 Mixed hyperlipidemia: Secondary | ICD-10-CM | POA: Diagnosis not present

## 2024-02-27 DIAGNOSIS — E039 Hypothyroidism, unspecified: Secondary | ICD-10-CM | POA: Diagnosis not present

## 2024-02-27 DIAGNOSIS — Z6828 Body mass index (BMI) 28.0-28.9, adult: Secondary | ICD-10-CM | POA: Diagnosis not present

## 2024-02-27 DIAGNOSIS — E663 Overweight: Secondary | ICD-10-CM | POA: Diagnosis not present

## 2024-02-27 DIAGNOSIS — I129 Hypertensive chronic kidney disease with stage 1 through stage 4 chronic kidney disease, or unspecified chronic kidney disease: Secondary | ICD-10-CM | POA: Diagnosis not present

## 2024-02-27 DIAGNOSIS — E1159 Type 2 diabetes mellitus with other circulatory complications: Secondary | ICD-10-CM | POA: Diagnosis not present

## 2024-06-11 DIAGNOSIS — I10 Essential (primary) hypertension: Secondary | ICD-10-CM | POA: Diagnosis not present

## 2024-06-11 DIAGNOSIS — H348312 Tributary (branch) retinal vein occlusion, right eye, stable: Secondary | ICD-10-CM | POA: Diagnosis not present

## 2024-06-11 DIAGNOSIS — Z6827 Body mass index (BMI) 27.0-27.9, adult: Secondary | ICD-10-CM | POA: Diagnosis not present

## 2024-06-11 DIAGNOSIS — E1169 Type 2 diabetes mellitus with other specified complication: Secondary | ICD-10-CM | POA: Diagnosis not present

## 2024-06-11 DIAGNOSIS — Z008 Encounter for other general examination: Secondary | ICD-10-CM | POA: Diagnosis not present

## 2024-06-11 DIAGNOSIS — E663 Overweight: Secondary | ICD-10-CM | POA: Diagnosis not present

## 2024-06-11 DIAGNOSIS — E785 Hyperlipidemia, unspecified: Secondary | ICD-10-CM | POA: Diagnosis not present

## 2024-07-02 DIAGNOSIS — E1159 Type 2 diabetes mellitus with other circulatory complications: Secondary | ICD-10-CM | POA: Diagnosis not present

## 2024-07-02 DIAGNOSIS — Z6827 Body mass index (BMI) 27.0-27.9, adult: Secondary | ICD-10-CM | POA: Diagnosis not present

## 2024-07-02 DIAGNOSIS — E039 Hypothyroidism, unspecified: Secondary | ICD-10-CM | POA: Diagnosis not present

## 2024-07-02 DIAGNOSIS — E663 Overweight: Secondary | ICD-10-CM | POA: Diagnosis not present

## 2024-07-02 DIAGNOSIS — B349 Viral infection, unspecified: Secondary | ICD-10-CM | POA: Diagnosis not present

## 2024-07-02 DIAGNOSIS — E782 Mixed hyperlipidemia: Secondary | ICD-10-CM | POA: Diagnosis not present
